# Patient Record
Sex: Male | Born: 1990 | Race: White | Hispanic: No | Marital: Single | Smoking: Current every day smoker
Health system: Southern US, Community
[De-identification: ages and names within clinical notes are randomized; demographics above are authoritative.]

## PROBLEM LIST (undated history)

## (undated) DIAGNOSIS — M549 Dorsalgia, unspecified: Secondary | ICD-10-CM

---
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
## 2014-01-07 ENCOUNTER — Emergency Department: Payer: Self-pay | Admitting: Emergency Medicine

## 2015-11-14 ENCOUNTER — Emergency Department: Payer: Self-pay

## 2015-11-14 ENCOUNTER — Emergency Department
Admission: EM | Admit: 2015-11-14 | Discharge: 2015-11-14 | Disposition: A | Discharge status: Home / Self Care | Payer: Self-pay | Attending: Emergency Medicine | Admitting: Emergency Medicine

## 2015-11-14 ENCOUNTER — Encounter: Payer: Self-pay | Admitting: *Deleted

## 2015-11-14 DIAGNOSIS — M5440 Lumbago with sciatica, unspecified side: Secondary | ICD-10-CM | POA: Insufficient documentation

## 2015-11-14 DIAGNOSIS — M5430 Sciatica, unspecified side: Secondary | ICD-10-CM

## 2015-11-14 DIAGNOSIS — F17210 Nicotine dependence, cigarettes, uncomplicated: Secondary | ICD-10-CM | POA: Insufficient documentation

## 2015-11-14 MED ORDER — HYDROMORPHONE HCL 1 MG/ML IJ SOLN
1.0000 mg | Freq: Once | INTRAMUSCULAR | Status: AC
  Administered 2015-11-14: 1 mg via INTRAMUSCULAR
  Filled 2015-11-14: qty 1

## 2015-11-14 MED ORDER — METHYLPREDNISOLONE 4 MG PO TBPK: ORAL_TABLET | ORAL | 0 refills | Status: DC

## 2015-11-14 MED ORDER — ORPHENADRINE CITRATE 30 MG/ML IJ SOLN
60.0000 mg | Freq: Two times a day (BID) | INTRAMUSCULAR | Status: DC
  Administered 2015-11-14: 60 mg via INTRAMUSCULAR
  Filled 2015-11-14: qty 2

## 2015-11-14 MED ORDER — DEXAMETHASONE SODIUM PHOSPHATE 10 MG/ML IJ SOLN
10.0000 mg | Freq: Once | INTRAMUSCULAR | Status: AC
  Administered 2015-11-14: 10 mg via INTRAMUSCULAR
  Filled 2015-11-14: qty 1

## 2015-11-14 MED ORDER — TRAMADOL HCL 50 MG PO TABS: 50.0000 mg | ORAL_TABLET | Freq: Four times a day (QID) | ORAL | 0 refills | Status: AC | PRN

## 2015-11-14 MED ORDER — CYCLOBENZAPRINE HCL 10 MG PO TABS: 10.0000 mg | ORAL_TABLET | Freq: Three times a day (TID) | ORAL | 0 refills | Status: DC | PRN

## 2015-11-14 NOTE — ED Triage Notes (Signed)
Pt complains of low back pain with pain radiating down legs, pt reports  Pain is greater on right side, pt denies any other symptoms

## 2015-11-14 NOTE — ED Notes (Signed)
Having lower back pain which radiates into both leg  Pain is worse on the right   Ambulates well to treatment

## 2015-11-14 NOTE — ED Provider Notes (Signed)
[facility] Emergency Department Provider Note   ____________________________________________   First MD Initiated Contact with Patient 11/14/15 1310     (approximate)  I have reviewed the triage vital signs and the nursing notes.   HISTORY  Chief Complaint Back Pain    HPI Timothy Fields is a 25 y.o. male patient complain of radicular low back pain to the bilateral extremities. Patient stated onset was this morning. Patient stated pain is greater on the right side. Patient denies any bladder or bowel dysfunction. Patient said is similar incident as a teenager approximately 5-7 years ago. Patient denies any provocative incident for his complaint. No palliative measures taken for his complaint.   History reviewed. No pertinent past medical history.  There are no active problems to display for this patient.   History reviewed. No pertinent surgical history.  Prior to Admission medications   Medication Sig Start Date End Date Taking? Authorizing Provider  cyclobenzaprine (FLEXERIL) 10 MG tablet Take 1 tablet (10 mg total) by mouth 3 (three) times daily as needed. 11/14/15   Joni Reiningonald K Elowen Debruyn, PA-C  methylPREDNISolone (MEDROL DOSEPAK) 4 MG TBPK tablet Take Tapered dose as directed 11/14/15   Joni Reiningonald K Callaway Hailes, PA-C  traMADol (ULTRAM) 50 MG tablet Take 1 tablet (50 mg total) by mouth every 6 (six) hours as needed. 11/14/15 11/13/16  Joni Reiningonald K Chares Slaymaker, PA-C    Allergies Review of patient's allergies indicates no known allergies.  No family history on file.  Social History Social History  Substance Use Topics  . Smoking status: Current Every Day Smoker    Types: Cigarettes  . Smokeless tobacco: Not on file  . Alcohol use No    Review of Systems Constitutional: No fever/chills Eyes: No visual changes. ENT: No sore throat. Cardiovascular: Denies chest pain. Respiratory: Denies shortness of breath. Gastrointestinal: No abdominal pain.  No nausea, no  vomiting.  No diarrhea.  No constipation. Genitourinary: Negative for dysuria. Musculoskeletal: No back pain  Skin: Negative for rash. Neurological: Negative for headaches, focal weakness or numbness. ____________________________________________   PHYSICAL EXAM:  VITAL SIGNS: ED Triage Vitals  Enc Vitals Group     BP 11/14/15 1300 (!) 147/85     Pulse Rate 11/14/15 1300 95     Resp 11/14/15 1300 20     Temp 11/14/15 1300 98.2 F (36.8 C)     Temp Source 11/14/15 1300 Oral     SpO2 11/14/15 1300 100 %     Weight 11/14/15 1301 245 lb (111.1 kg)     Height 11/14/15 1301 6\' 1"  (1.854 m)     Head Circumference --      Peak Flow --      Pain Score 11/14/15 1257 10     Pain Loc --      Pain Edu? --      Excl. in GC? --     Constitutional: Alert and oriented. Well appearing and in no acute distress. Eyes: Conjunctivae are normal. PERRL. EOMI. Head: Atraumatic. Nose: No congestion/rhinnorhea. Mouth/Throat: Mucous membranes are moist.  Oropharynx non-erythematous. Neck: No stridor.  No cervical spine tenderness to palpation. Hematological/Lymphatic/Immunilogical: No cervical lymphadenopathy. Cardiovascular: Normal rate, regular rhythm. Grossly normal heart sounds.  Good peripheral circulation. Respiratory: Normal respiratory effort.  No retractions. Lungs CTAB. Gastrointestinal: Soft and nontender. No distention. No abdominal bruits. No CVA tenderness. Musculoskeletal: No obvious deformities to the low back. Patient has moderate guarding palpation L2-L5 5. Patient decreased range of motion in all fields. The patient  hasn't negative straight leg test.  Neurologic:  Normal speech and language. No gross focal neurologic deficits are appreciated. No gait instability. Skin:  Skin is warm, dry and intact. No rash noted. Psychiatric: Mood and affect are normal. Speech and behavior are normal.  ____________________________________________   LABS (all labs ordered are listed, but only  abnormal results are displayed)  Labs Reviewed - No data to display ____________________________________________  EKG   ____________________________________________  RADIOLOGY  No acute final x-ray of the lumbar spine. ____________________________________________   PROCEDURES  Procedure(s) performed: None  Procedures  Critical Care performed: No  ____________________________________________   INITIAL IMPRESSION / ASSESSMENT AND PLAN / ED COURSE  Pertinent labs & imaging results that were available during my care of the patient were reviewed by me and considered in my medical decision making (see chart for details).  Radicular back pain. Discussed x-ray finding with patient. Patient given discharge care instructions. Advised [facility]. Patient given a prescription for tramadol, Flexeril, and Medrol Dosepak.  Clinical Course     ____________________________________________   FINAL CLINICAL IMPRESSION(S) / ED DIAGNOSES  Final diagnoses:  Sciatica, unspecified laterality      NEW MEDICATIONS STARTED DURING THIS VISIT:  New Prescriptions   CYCLOBENZAPRINE (FLEXERIL) 10 MG TABLET    Take 1 tablet (10 mg total) by mouth 3 (three) times daily as needed.   METHYLPREDNISOLONE (MEDROL DOSEPAK) 4 MG TBPK TABLET    Take Tapered dose as directed   TRAMADOL (ULTRAM) 50 MG TABLET    Take 1 tablet (50 mg total) by mouth every 6 (six) hours as needed.     Note:  This document was prepared using Dragon voice recognition software and [place] include unintentional dictation errors.    Joni Reining, PA-C 11/14/15 1400    Governor Rooks, MD 11/14/15 [phone]

## 2015-12-29 ENCOUNTER — Emergency Department: Payer: Self-pay

## 2015-12-29 ENCOUNTER — Encounter: Payer: Self-pay | Admitting: Emergency Medicine

## 2015-12-29 ENCOUNTER — Emergency Department
Admission: EM | Admit: 2015-12-29 | Discharge: 2015-12-29 | Disposition: A | Discharge status: Home / Self Care | Payer: Self-pay | Attending: Student in an Organized Health Care Education/Training Program | Admitting: Student in an Organized Health Care Education/Training Program

## 2015-12-29 DIAGNOSIS — R079 Chest pain, unspecified: Secondary | ICD-10-CM | POA: Insufficient documentation

## 2015-12-29 DIAGNOSIS — F17210 Nicotine dependence, cigarettes, uncomplicated: Secondary | ICD-10-CM | POA: Insufficient documentation

## 2015-12-29 DIAGNOSIS — Z79899 Other long term (current) drug therapy: Secondary | ICD-10-CM | POA: Insufficient documentation

## 2015-12-29 DIAGNOSIS — M79604 Pain in right leg: Secondary | ICD-10-CM

## 2015-12-29 DIAGNOSIS — M79662 Pain in left lower leg: Secondary | ICD-10-CM | POA: Insufficient documentation

## 2015-12-29 DIAGNOSIS — M79605 Pain in left leg: Secondary | ICD-10-CM

## 2015-12-29 DIAGNOSIS — M79661 Pain in right lower leg: Secondary | ICD-10-CM | POA: Insufficient documentation

## 2015-12-29 MED ORDER — LORAZEPAM 1 MG PO TABS
ORAL_TABLET | ORAL | Status: AC
  Filled 2015-12-29: qty 1

## 2015-12-29 MED ORDER — METHOCARBAMOL 500 MG PO TABS: 500.0000 mg | ORAL_TABLET | Freq: Three times a day (TID) | ORAL | 0 refills | Status: DC | PRN

## 2015-12-29 MED ORDER — LORAZEPAM 1 MG PO TABS
1.0000 mg | ORAL_TABLET | Freq: Once | ORAL | Status: AC
  Administered 2015-12-29: 1 mg via ORAL

## 2015-12-29 NOTE — ED Provider Notes (Signed)
[facility] Emergency Department Provider Note    First MD Initiated Contact with Patient 12/29/15 1356     (approximate)  I have reviewed the triage vital signs and the nursing notes.   HISTORY  Chief Complaint Leg Pain; Shortness of Breath; and Back Pain    HPI Timothy Fields is a 25 y.o. male  who presents with chief complaint of bilateral leg throbbing and left-sided back pain for greater than 2 weeks. Patient states she also started developing a cough yesterday and feels a tightness in the left side of his chest with numbness and tingling radiating to bilateral upper extremities. States that his leg pain is worse after standing or walking for a long period of time. Denies any swelling. No fevers. No chest pain at this time. States she's been having these symptoms on and off since he was 25 years old. Was previously seen for sciatica states this is different. No history of blood clots. Does not smoke. States he has been feeling anxious lately but is not on any antidepressant or anti-anxiety medications. Denies any SI or HI.   History reviewed. No pertinent past medical history. History reviewed. No pertinent family history. History reviewed. No pertinent surgical history. There are no active problems to display for this patient.     Prior to Admission medications   Medication Sig Start Date End Date Taking? Authorizing Provider  cyclobenzaprine (FLEXERIL) 10 MG tablet Take 1 tablet (10 mg total) by mouth 3 (three) times daily as needed. 11/14/15   Joni Reiningonald K Smith, PA-C  methylPREDNISolone (MEDROL DOSEPAK) 4 MG TBPK tablet Take Tapered dose as directed 11/14/15   Joni Reiningonald K Smith, PA-C  traMADol (ULTRAM) 50 MG tablet Take 1 tablet (50 mg total) by mouth every 6 (six) hours as needed. 11/14/15 11/13/16  Joni Reiningonald K Smith, PA-C    Allergies Ibuprofen and Tylenol [acetaminophen]    Social History Social History  Substance Use Topics  . Smoking status:  Current Every Day Smoker    Types: Cigarettes  . Smokeless tobacco: Never Used  . Alcohol use No    Review of Systems Patient denies headaches, rhinorrhea, blurry vision, numbness, shortness of breath, chest pain, edema, cough, abdominal pain, nausea, vomiting, diarrhea, dysuria, fevers, rashes or hallucinations unless otherwise stated above in HPI. ____________________________________________   PHYSICAL EXAM:  VITAL SIGNS: Vitals:   12/29/15 1347  BP: (!) 142/90  Pulse: 84  Resp: 16  Temp: 98 F (36.7 C)    Constitutional: Alert and oriented. anxious appearing but in no acute distress. Eyes: Conjunctivae are normal. PERRL. EOMI. Head: Atraumatic. Nose: No congestion/rhinnorhea. Mouth/Throat: Mucous membranes are moist.  Oropharynx non-erythematous. Neck: No stridor. Painless ROM. No cervical spine tenderness to palpation Hematological/Lymphatic/Immunilogical: No cervical lymphadenopathy. Cardiovascular: Normal rate, regular rhythm. Grossly normal heart sounds.  Good peripheral circulation. Respiratory: Normal respiratory effort.  No retractions. Lungs CTAB. Gastrointestinal: Soft and nontender. No distention. No abdominal bruits. No CVA tenderness.  Musculoskeletal: No lower extremity tenderness nor edema. Reflexes are 2+ bilaterally, No joint effusions. Neurologic:  CN- intact.  No facial droop, Normal FNF.  Normal heel to shin.  Sensation intact bilaterally. Normal speech and language. No gross focal neurologic deficits are appreciated. No gait instability.  Able to ambulate down hall without any sign of fatigue or distress  Skin:  Skin is warm, dry and intact. No rash noted. Psychiatric: Mood and affect are normal. Speech and behavior are normal.  ____________________________________________   LABS (all labs ordered are  listed, but only abnormal results are displayed)  No results found for this or any previous visit (from the past 24  hour(s)). ____________________________________________  EKG My review and personal interpretation at Time: 14:18   Indication: chest pain  Rate: 75  Rhythm: sinus Axis: normal Other: no acute st changes, normal intervals, no WPW or brugada ____________________________________________  RADIOLOGY  I personally reviewed all radiographic images ordered to evaluate for the above acute complaints and reviewed radiology reports and findings.  These findings were personally discussed with the patient.  Please see medical record for radiology report.  ____________________________________________   PROCEDURES  Procedure(s) performed: none Procedures    Critical Care performed: no ____________________________________________   INITIAL IMPRESSION / ASSESSMENT AND PLAN / ED COURSE  Pertinent labs & imaging results that were available during my care of the patient were reviewed by me and considered in my medical decision making (see chart for details).  DDX: pna, dvt, pe, neuropathic pain, claudication  Timothy Fields is a 25 y.o. who presents to the ED with above complaint of leg pain and left-sided chest pain. Patient afebrile and hemodynamically stable. Patient is low risk Wells and Perc negative. Do not feel is clinically consistent with PE. His lower extremity is well perfused with strong PT and DP pulses and brisk cap refill. Do not feel this consistent with claudication. Other pathologies such as Guillain-Barr also come to mind but clinically not demonstrating any sign of neurologic deficit and has not had any recent viral illness. Other than subjective complaint of achy pain in his legs which is been ongoing intermittently for 17 years he has no objective findings of pathology. Will order chest x-ray to evaluate for any pneumonia. We will order EKG to evaluate for any pericarditis or ischemic changes. Will order ultrasound of lower extremity to evaluate for any signs of DVT. Patient is very  anxious appearing with history of reported anxiety which [place] be highly contributing to his presentation as he is an otherwise healthy 25 year old male.  Clinical Course as of Dec 28 1541  Fri Dec 29, 2015  1449 US Venous Img Lower Bilateral [PR]  1535 Chest x-ray without any focal infiltrate. Ultrasound without any DVT. EKG shows no acute ischemia or dysrhythmia. Do not find any objective signs of pathology and do not feel laboratory testing clinically indicated this time. We'll provide referral for outpatient management as well as multiple relaxers. Patient was able to tolerate PO and was able to ambulate with a steady gait. Have discussed with the patient and available family all diagnostics and treatments performed thus far and all questions were answered to the best of my ability. The patient demonstrates understanding and agreement with plan.   [PR]    Clinical Course User Index [PR] Willy EddyPatrick Wreatha Sturgeon, MD     ____________________________________________   FINAL CLINICAL IMPRESSION(S) / ED DIAGNOSES  Final diagnoses:  Chest pain, unspecified type  Pain in both lower extremities      NEW MEDICATIONS STARTED DURING THIS VISIT:  New Prescriptions   No medications on file     Note:  This document was prepared using Dragon voice recognition software and Parr include unintentional dictation errors.    Willy EddyPatrick Disaya Walt, MD 12/29/15 [phone]

## 2015-12-29 NOTE — ED Triage Notes (Signed)
Pt to ED c/o bilateral leg and left back pain x1 week.  States "feels like it's my lungs".  States feels tight and stabbing to legs and lungs.  Pt presents A&Ox4, chest rise even and unlabored, skim warm and clammy.

## 2016-02-17 ENCOUNTER — Emergency Department: Payer: Self-pay

## 2016-02-17 ENCOUNTER — Encounter: Payer: Self-pay | Admitting: Emergency Medicine

## 2016-02-17 ENCOUNTER — Emergency Department
Admission: EM | Admit: 2016-02-17 | Discharge: 2016-02-17 | Disposition: A | Discharge status: Home / Self Care | Payer: Self-pay | Attending: Emergency Medicine | Admitting: Emergency Medicine

## 2016-02-17 DIAGNOSIS — J189 Pneumonia, unspecified organism: Secondary | ICD-10-CM | POA: Insufficient documentation

## 2016-02-17 DIAGNOSIS — F17210 Nicotine dependence, cigarettes, uncomplicated: Secondary | ICD-10-CM | POA: Insufficient documentation

## 2016-02-17 DIAGNOSIS — Z79899 Other long term (current) drug therapy: Secondary | ICD-10-CM | POA: Insufficient documentation

## 2016-02-17 HISTORY — DX: Dorsalgia, unspecified: M54.9

## 2016-02-17 LAB — CBC
HCT: 50.2 % (ref 40.0–52.0)
Hemoglobin: 17.2 g/dL (ref 13.0–18.0)
MCH: 31 pg (ref 26.0–34.0)
MCHC: 34.3 g/dL (ref 32.0–36.0)
MCV: 90.3 fL (ref 80.0–100.0)
PLATELETS: 218 10*3/uL (ref 150–440)
RBC: 5.56 MIL/uL (ref 4.40–5.90)
RDW: 13.6 % (ref 11.5–14.5)
WBC: 13.2 10*3/uL — AB (ref 3.8–10.6)

## 2016-02-17 LAB — BASIC METABOLIC PANEL
Anion gap: 8 (ref 5–15)
BUN: 11 mg/dL (ref 6–20)
CALCIUM: 9.7 mg/dL (ref 8.9–10.3)
CO2: 24 mmol/L (ref 22–32)
CREATININE: 1.16 mg/dL (ref 0.61–1.24)
Chloride: 106 mmol/L (ref 101–111)
GFR calc Af Amer: >60 mL/min (ref >60)
Glucose, Bld: 100 mg/dL — ABNORMAL HIGH (ref 65–99)
Potassium: 4.1 mmol/L (ref 3.5–5.1)
SODIUM: 138 mmol/L (ref 135–145)

## 2016-02-17 LAB — RAPID HIV SCREEN (HIV 1/2 AB+AG)
HIV 1/2 ANTIBODIES: NONREACTIVE
HIV-1 P24 ANTIGEN - HIV24: NONREACTIVE

## 2016-02-17 LAB — TROPONIN I

## 2016-02-17 MED ORDER — SODIUM CHLORIDE 0.9 % IV BOLUS (SEPSIS)
1000.0000 mL | Freq: Once | INTRAVENOUS | Status: AC
  Administered 2016-02-17: 1000 mL via INTRAVENOUS

## 2016-02-17 MED ORDER — IPRATROPIUM-ALBUTEROL 0.5-2.5 (3) MG/3ML IN SOLN
3.0000 mL | Freq: Once | RESPIRATORY_TRACT | Status: AC
  Administered 2016-02-17: 3 mL via RESPIRATORY_TRACT
  Filled 2016-02-17: qty 3

## 2016-02-17 MED ORDER — DOXYCYCLINE HYCLATE 100 MG PO CAPS: 100.0000 mg | ORAL_CAPSULE | Freq: Two times a day (BID) | ORAL | 0 refills | Status: AC

## 2016-02-17 MED ORDER — DOXYCYCLINE HYCLATE 100 MG PO TABS
100.0000 mg | ORAL_TABLET | Freq: Once | ORAL | Status: AC
  Administered 2016-02-17: 100 mg via ORAL
  Filled 2016-02-17: qty 1

## 2016-02-17 MED ORDER — KETOROLAC TROMETHAMINE 30 MG/ML IJ SOLN
INTRAMUSCULAR | Status: AC
  Administered 2016-02-17: 30 mg via INTRAVENOUS
  Filled 2016-02-17: qty 1

## 2016-02-17 MED ORDER — KETOROLAC TROMETHAMINE 30 MG/ML IJ SOLN
30.0000 mg | Freq: Once | INTRAMUSCULAR | Status: AC
  Administered 2016-02-17: 30 mg via INTRAVENOUS

## 2016-02-17 MED ORDER — ALBUTEROL SULFATE HFA 108 (90 BASE) MCG/ACT IN AERS: 2.0000 | INHALATION_SPRAY | Freq: Four times a day (QID) | RESPIRATORY_TRACT | 2 refills | Status: DC | PRN

## 2016-02-17 MED ORDER — IPRATROPIUM-ALBUTEROL 0.5-2.5 (3) MG/3ML IN SOLN
3.0000 mL | Freq: Once | RESPIRATORY_TRACT | Status: AC
  Administered 2016-02-17: 3 mL via RESPIRATORY_TRACT

## 2016-02-17 NOTE — ED Notes (Signed)
Patient's discharge and follow up information reviewed with patient by ED nursing staff and patient given the opportunity to ask questions pertaining to ED visit and discharge plan of care. Patient advised that should symptoms not continue to improve, resolve entirely, or should new symptoms develop then a follow up visit with their PCP or a return visit to the ED [facility] be warranted. Patient verbalized consent and understanding of discharge plan of care including potential need for further evaluation. Patient discharged in stable condition per attending ED physician on duty.  

## 2016-02-17 NOTE — ED Provider Notes (Signed)
[facility] Emergency Department Provider Note  ____________________________________________  Time seen: Approximately 3:14 PM  I have reviewed the triage vital signs and the nursing notes.   HISTORY  Chief Complaint Tachycardia and Chest Pain   HPI Timothy Fields is a 26 y.o. male a history of heavy smoking who presents for evaluation of coughing and chest pain. Patient reports that week of cough productive of orange sputum and for the last few days has had chest pain. The chest pain as a soreness, severe, located in the center of his chest, worse when he coughs. Today he had a coughing fit that was so severe that he felt like his heart was coming to jump out of his chest and he felt dizzy. No fever or chills, no nausea or vomiting, no diarrhea. Patient endorses family history of heart disease in his father's side. No personal or family history blood clots, no hemoptysis, no recent travel or immobilization, no leg pain or swelling. His nephew was recently diagnosed with the flu. Patient also endorses mild shortness of breath since the coughing started. He continues to smoke.  Past Medical History:  Diagnosis Date  . Back pain     There are no active problems to display for this patient.   History reviewed. No pertinent surgical history.  Prior to Admission medications   Medication Sig Start Date End Date Taking? Authorizing Provider  cyclobenzaprine (FLEXERIL) 10 MG tablet Take 1 tablet (10 mg total) by mouth 3 (three) times daily as needed. 11/14/15  Yes Joni Reining, PA-C  methocarbamol (ROBAXIN) 500 MG tablet Take 1 tablet (500 mg total) by mouth every 8 (eight) hours as needed for muscle spasms. 12/29/15  Yes Willy Eddy, MD  traMADol (ULTRAM) 50 MG tablet Take 1 tablet (50 mg total) by mouth every 6 (six) hours as needed. 11/14/15 11/13/16 Yes Joni Reining, PA-C  albuterol (PROVENTIL HFA;VENTOLIN HFA) 108 (90 Base) MCG/ACT inhaler Inhale 2 puffs  into the lungs every 6 (six) hours as needed for wheezing or shortness of breath. 02/17/16   Nita Sickle, MD  doxycycline (VIBRAMYCIN) 100 MG capsule Take 1 capsule (100 mg total) by mouth 2 (two) times daily. 02/17/16 02/24/16  Nita Sickle, MD  methylPREDNISolone (MEDROL DOSEPAK) 4 MG TBPK tablet Take Tapered dose as directed Patient not taking: Reported on 02/17/2016 11/14/15   Joni Reining, PA-C    Allergies Ibuprofen and Tylenol [acetaminophen]  History reviewed. No pertinent family history.  Social History Social History  Substance Use Topics  . Smoking status: Current Every Day Smoker    Packs/day: 2.00    Types: Cigarettes  . Smokeless tobacco: Never Used  . Alcohol use No    Review of Systems  Constitutional: Negative for fever. Eyes: Negative for visual changes. ENT: Negative for sore throat. Neck: No neck pain  Cardiovascular: + chest pain. Respiratory: + shortness of breath, cough Gastrointestinal: Negative for abdominal pain, vomiting or diarrhea. Genitourinary: Negative for dysuria. Musculoskeletal: Negative for back pain. Skin: Negative for rash. Neurological: Negative for headaches, weakness or numbness. Psych: No SI or HI  ____________________________________________   PHYSICAL EXAM:  VITAL SIGNS: ED Triage Vitals  Enc Vitals Group     BP 02/17/16 1337 129/81     Pulse Rate 02/17/16 1337 (!) 108     Resp 02/17/16 1337 16     Temp 02/17/16 1337 98.4 F (36.9 C)     Temp Source 02/17/16 1337 Oral     SpO2 02/17/16  1337 98 %     Weight 02/17/16 1338 245 lb (111.1 kg)     Height 02/17/16 1338 6\' 1"  (1.854 m)     Head Circumference --      Peak Flow --      Pain Score 02/17/16 1338 7     Pain Loc --      Pain Edu? --      Excl. in GC? --     Constitutional: Alert and oriented. Well appearing and in no apparent distress. HEENT:      Head: Normocephalic and atraumatic.         Eyes: Conjunctivae are normal. Sclera is non-icteric. EOMI.  PERRL      Mouth/Throat: Mucous membranes are moist.       Neck: Supple with no signs of meningismus. Cardiovascular: Tachycardic with regular rhythm. No murmurs, gallops, or rubs. 2+ symmetrical distal pulses are present in all extremities. No JVD. Respiratory: Normal respiratory effort. Decreased air movement bilaterally with no wheezes or crackles Gastrointestinal: Soft, non tender, and non distended with positive bowel sounds. No rebound or guarding. Musculoskeletal: Nontender with normal range of motion in all extremities. No edema, cyanosis, or erythema of extremities. Neurologic: Normal speech and language. Face is symmetric. Moving all extremities. No gross focal neurologic deficits are appreciated. Skin: Skin is warm, dry and intact. No rash noted. Psychiatric: Mood and affect are normal. Speech and behavior are normal.  ____________________________________________   LABS (all labs ordered are listed, but only abnormal results are displayed)  Labs Reviewed  BASIC METABOLIC PANEL - Abnormal; Notable for the following:       Result Value   Glucose, Bld 100 (*)    All other components within normal limits  CBC - Abnormal; Notable for the following:    WBC 13.2 (*)    All other components within normal limits  TROPONIN I  RAPID HIV SCREEN (HIV 1/2 AB+AG)   ____________________________________________  EKG   ED ECG REPORT I, Nita Sicklearolina Maddalena Linarez, the attending physician, personally viewed and interpreted this ECG.  Sinus tachycardia with rare PVCs, rate of 109, normal intervals, normal axis, no ST elevations or depressions. ____________________________________________  RADIOLOGY  CXR:  Subtle opacification of the right middle lobe which Antonetti be due to atelectasis versus early infection. ____________________________________________   PROCEDURES  Procedure(s) performed: None Procedures Critical Care performed:   None ____________________________________________   INITIAL IMPRESSION / ASSESSMENT AND PLAN / ED COURSE  26 y.o. male a history of heavy smoking who presents for evaluation of coughing and chest pain x 1 week. Patient is afebrile, tachycardic with heart rate of 108, chest x-ray concerning for pneumonia, patient with mild leukocytosis consistent with pneumonia picture. Also has a history of heavy smoking for 10 years with decreased air movement bilaterally. We'll give DuoNeb treatments, doxycycline, IV fluids for tachycardia, and Toradol for the chest discomfort. EKG with of this of ischemia. Troponin is negative.  Clinical Course as of Feb 17 1707  Sat Feb 17, 2016  1707 Vital signs normalized after IV fluids. Patient's chest pain and shortness of breath resolved after DuoNeb treatment. He is going to be discharged home on albuterol, doxycycline for pneumonia and close follow-up with primary care doctor. Smoking cessation counseling was provided to patient.  [CV]    Clinical Course User Index [CV] Nita Sicklearolina Suleyman Ehrman, MD    Pertinent labs & imaging results that were available during my care of the patient were reviewed by me and considered in my medical decision making (  see chart for details).    ____________________________________________   FINAL CLINICAL IMPRESSION(S) / ED DIAGNOSES  Final diagnoses:  Community acquired pneumonia, unspecified laterality      NEW MEDICATIONS STARTED DURING THIS VISIT:  New Prescriptions   ALBUTEROL (PROVENTIL HFA;VENTOLIN HFA) 108 (90 BASE) MCG/ACT INHALER    Inhale 2 puffs into the lungs every 6 (six) hours as needed for wheezing or shortness of breath.   DOXYCYCLINE (VIBRAMYCIN) 100 MG CAPSULE    Take 1 capsule (100 mg total) by mouth 2 (two) times daily.     Note:  This document was prepared using Dragon voice recognition software and Hasty include unintentional dictation errors.    Nita Sickle, MD 02/17/16 [phone]

## 2016-02-17 NOTE — ED Notes (Signed)
Patient c/o intermittent left chest pain radiating to back X several months. Productive cough with orange sputum X 2 weeks.

## 2016-02-17 NOTE — ED Triage Notes (Signed)
Pt reports having had a cough and chest pain for over a week. Pt reports chest pain felt "like my heart was going to rip out of my chest today." Pt reports becoming lightheaded while coughing and having a near syncopal episode. Pt denies fevers but reports nausea over the past week as well. Pt also reports increased fatigue. Pt reports nephew has recently had the flu.

## 2017-04-16 ENCOUNTER — Emergency Department
Admission: EM | Admit: 2017-04-16 | Discharge: 2017-04-16 | Disposition: A | Discharge status: Home / Self Care | Payer: Self-pay | Attending: Emergency Medicine | Admitting: Emergency Medicine

## 2017-04-16 ENCOUNTER — Other Ambulatory Visit: Payer: Self-pay

## 2017-04-16 ENCOUNTER — Emergency Department: Payer: Self-pay

## 2017-04-16 ENCOUNTER — Encounter: Payer: Self-pay | Admitting: Emergency Medicine

## 2017-04-16 DIAGNOSIS — Y929 Unspecified place or not applicable: Secondary | ICD-10-CM | POA: Insufficient documentation

## 2017-04-16 DIAGNOSIS — S29019A Strain of muscle and tendon of unspecified wall of thorax, initial encounter: Secondary | ICD-10-CM | POA: Insufficient documentation

## 2017-04-16 DIAGNOSIS — Z87891 Personal history of nicotine dependence: Secondary | ICD-10-CM | POA: Insufficient documentation

## 2017-04-16 DIAGNOSIS — X58XXXA Exposure to other specified factors, initial encounter: Secondary | ICD-10-CM | POA: Insufficient documentation

## 2017-04-16 DIAGNOSIS — R112 Nausea with vomiting, unspecified: Secondary | ICD-10-CM | POA: Insufficient documentation

## 2017-04-16 DIAGNOSIS — R0602 Shortness of breath: Secondary | ICD-10-CM | POA: Insufficient documentation

## 2017-04-16 DIAGNOSIS — J40 Bronchitis, not specified as acute or chronic: Secondary | ICD-10-CM | POA: Insufficient documentation

## 2017-04-16 DIAGNOSIS — R05 Cough: Secondary | ICD-10-CM | POA: Insufficient documentation

## 2017-04-16 DIAGNOSIS — R042 Hemoptysis: Secondary | ICD-10-CM | POA: Insufficient documentation

## 2017-04-16 DIAGNOSIS — Y939 Activity, unspecified: Secondary | ICD-10-CM | POA: Insufficient documentation

## 2017-04-16 DIAGNOSIS — Y998 Other external cause status: Secondary | ICD-10-CM | POA: Insufficient documentation

## 2017-04-16 LAB — BASIC METABOLIC PANEL
ANION GAP: 10 (ref 5–15)
BUN: 7 mg/dL (ref 6–20)
CO2: 27 mmol/L (ref 22–32)
Calcium: 9.4 mg/dL (ref 8.9–10.3)
Chloride: 105 mmol/L (ref 101–111)
Creatinine, Ser: 1.07 mg/dL (ref 0.61–1.24)
Glucose, Bld: 82 mg/dL (ref 65–99)
Potassium: 3.8 mmol/L (ref 3.5–5.1)
SODIUM: 142 mmol/L (ref 135–145)

## 2017-04-16 LAB — CBC
HCT: 46.1 % (ref 40.0–52.0)
HEMOGLOBIN: 15.9 g/dL (ref 13.0–18.0)
MCH: 31.2 pg (ref 26.0–34.0)
MCHC: 34.6 g/dL (ref 32.0–36.0)
MCV: 90.3 fL (ref 80.0–100.0)
PLATELETS: 217 10*3/uL (ref 150–440)
RBC: 5.11 MIL/uL (ref 4.40–5.90)
RDW: 13.4 % (ref 11.5–14.5)
WBC: 7.8 10*3/uL (ref 3.8–10.6)

## 2017-04-16 LAB — FIBRIN DERIVATIVES D-DIMER (ARMC ONLY): FIBRIN DERIVATIVES D-DIMER (ARMC): 162.43 ng{FEU}/mL (ref 0.00–499.00)

## 2017-04-16 LAB — TROPONIN I

## 2017-04-16 MED ORDER — FAMOTIDINE 20 MG PO TABS: 20.0000 mg | ORAL_TABLET | Freq: Two times a day (BID) | ORAL | 0 refills | Status: DC

## 2017-04-16 MED ORDER — IBUPROFEN 600 MG PO TABS: 600.0000 mg | ORAL_TABLET | Freq: Four times a day (QID) | ORAL | 0 refills | Status: DC | PRN

## 2017-04-16 MED ORDER — PREDNISONE 20 MG PO TABS: 60.0000 mg | ORAL_TABLET | Freq: Every day | ORAL | 0 refills | Status: AC

## 2017-04-16 MED ORDER — LIDOCAINE 5 % EX PTCH
1.0000 | MEDICATED_PATCH | CUTANEOUS | Status: DC
  Administered 2017-04-16: 1 via TRANSDERMAL
  Filled 2017-04-16: qty 1

## 2017-04-16 MED ORDER — ALBUTEROL SULFATE HFA 108 (90 BASE) MCG/ACT IN AERS: 2.0000 | INHALATION_SPRAY | Freq: Four times a day (QID) | RESPIRATORY_TRACT | 2 refills | Status: DC | PRN

## 2017-04-16 MED ORDER — PREDNISONE 20 MG PO TABS
60.0000 mg | ORAL_TABLET | Freq: Once | ORAL | Status: AC
  Administered 2017-04-16: 60 mg via ORAL
  Filled 2017-04-16: qty 3

## 2017-04-16 MED ORDER — IPRATROPIUM-ALBUTEROL 0.5-2.5 (3) MG/3ML IN SOLN
3.0000 mL | Freq: Once | RESPIRATORY_TRACT | Status: AC
  Administered 2017-04-16: 3 mL via RESPIRATORY_TRACT
  Filled 2017-04-16: qty 3

## 2017-04-16 MED ORDER — KETOROLAC TROMETHAMINE 30 MG/ML IJ SOLN
15.0000 mg | Freq: Once | INTRAMUSCULAR | Status: AC
  Administered 2017-04-16: 15 mg via INTRAVENOUS
  Filled 2017-04-16: qty 1

## 2017-04-16 MED ORDER — ONDANSETRON HCL 4 MG/2ML IJ SOLN
4.0000 mg | Freq: Once | INTRAMUSCULAR | Status: AC
  Administered 2017-04-16: 4 mg via INTRAVENOUS
  Filled 2017-04-16: qty 2

## 2017-04-16 NOTE — ED Provider Notes (Signed)
[facility] Emergency Department Provider Note  ____________________________________________  Time seen: Approximately 9:06 PM  I have reviewed the triage vital signs and the nursing notes.   HISTORY  Chief Complaint Back Pain and Cough   HPI Timothy Fields is a 27 y.o. male history of depression, former smoking and presents for evaluation of left upper back pain and hemoptysis. Patient reports one week of sharp pleuritic stabbing pain located in the left upper back. The pain is severe with coughing or deep breathing. Associated with shortness of breath and hemoptysis. Also has had nausea and vomiting for a week. No fever or chills. Patient reports that he stop smoking a year ago when he was seen here and diagnosed with pneumonia. He denies leg pain or swelling, personal or family history of blood clots, recent travel or immobilization, exogenous hormones, or history of cancer.  Past Medical History:  Diagnosis Date  . Back pain     Prior to Admission medications   Medication Sig Start Date End Date Taking? Authorizing Provider  albuterol (PROVENTIL HFA;VENTOLIN HFA) 108 (90 Base) MCG/ACT inhaler Inhale 2 puffs into the lungs every 6 (six) hours as needed for wheezing or shortness of breath. 04/16/17   Don PerkingVeronese, [place], MD  cyclobenzaprine (FLEXERIL) 10 MG tablet Take 1 tablet (10 mg total) by mouth 3 (three) times daily as needed. 11/14/15   Joni ReiningSmith, Ronald K, PA-C  famotidine (PEPCID) 20 MG tablet Take 1 tablet (20 mg total) by mouth 2 (two) times daily for 5 days. 04/16/17 04/21/17  Nita SickleVeronese, [place], MD  ibuprofen (ADVIL,MOTRIN) 600 MG tablet Take 1 tablet (600 mg total) by mouth every 6 (six) hours as needed. 04/16/17   Nita SickleVeronese, [place], MD  methocarbamol (ROBAXIN) 500 MG tablet Take 1 tablet (500 mg total) by mouth every 8 (eight) hours as needed for muscle spasms. 12/29/15   Willy Eddyobinson, [place], MD  methylPREDNISolone (MEDROL DOSEPAK) 4 MG TBPK tablet Take  Tapered dose as directed Patient not taking: Reported on 02/17/2016 11/14/15   Joni ReiningSmith, Ronald K, PA-C  predniSONE (DELTASONE) 20 MG tablet Take 3 tablets (60 mg total) by mouth daily for 4 days. 04/16/17 04/20/17  Nita SickleVeronese, [place], MD    Allergies Ibuprofen; Tramadol; and Tylenol [acetaminophen]  No family history on file.  Social History Social History   Tobacco Use  . Smoking status: Former Smoker    Packs/day: 2.00    Types: Cigarettes  . Smokeless tobacco: Never Used  Substance Use Topics  . Alcohol use: No  . Drug use: No    Review of Systems  Constitutional: Negative for fever. Eyes: Negative for visual changes. ENT: Negative for sore throat. Neck: No neck pain  Cardiovascular: Negative for chest pain. Respiratory: + shortness of breath, hemoptysis Gastrointestinal: Negative for abdominal pain,  Diarrhea. + N.V Genitourinary: Negative for dysuria. Musculoskeletal: + L upper back pain. Skin: Negative for rash. Neurological: Negative for headaches, weakness or numbness. Psych: No SI or HI  ____________________________________________   PHYSICAL EXAM:  VITAL SIGNS: ED Triage Vitals  Enc Vitals Group     BP 04/16/17 2041 128/90     Pulse Rate 04/16/17 2041 77     Resp 04/16/17 2041 18     Temp 04/16/17 2041 98.4 F (36.9 C)     Temp Source 04/16/17 2041 Oral     SpO2 04/16/17 2041 97 %     Weight 04/16/17 2022 240 lb (108.9 kg)     Height 04/16/17 2022 6\' 1"  (1.854 m)  Head Circumference --      Peak Flow --      Pain Score 04/16/17 2021 10     Pain Loc --      Pain Edu? --      Excl. in GC? --     Constitutional: Alert and oriented. Well appearing and in no apparent distress. HEENT:      Head: Normocephalic and atraumatic.         Eyes: Conjunctivae are normal. Sclera is non-icteric.       Mouth/Throat: Mucous membranes are moist.       Neck: Supple with no signs of meningismus. Cardiovascular: Regular rate and rhythm. No murmurs, gallops, or  rubs. 2+ symmetrical distal pulses are present in all extremities. No JVD. Respiratory: Normal respiratory effort. normal sats, decreased air movement bilaterally with no wheezing or crackles. Gastrointestinal: Soft, non tender, and non distended with positive bowel sounds. No rebound or guarding. Genitourinary: No CVA tenderness. Musculoskeletal: Nontender with normal range of motion in all extremities. No edema, cyanosis, or erythema of extremities. Neurologic: Normal speech and language. Face is symmetric. Moving all extremities. No gross focal neurologic deficits are appreciated. Skin: Skin is warm, dry and intact. No rash noted. Psychiatric: Mood and affect are normal. Speech and behavior are normal.  ____________________________________________   LABS (all labs ordered are listed, but only abnormal results are displayed)  Labs Reviewed  BASIC METABOLIC PANEL  CBC  TROPONIN I  FIBRIN DERIVATIVES D-DIMER ([facility] ONLY)   ____________________________________________  EKG  ED ECG REPORT I, Nita Sickle, the attending physician, personally viewed and interpreted this ECG.  Normal sinus rhythm, rate of 81, normal intervals, normal axis, no ST elevations or depressions. Normal EKG.  ____________________________________________  RADIOLOGY  I have personally reviewed the images performed during this visit and I agree with the Radiologist's read.   Interpretation by Radiologist:  Dg Chest 2 View  Result Date: 04/16/2017 CLINICAL DATA:  Left upper back pain for 1 week with shortness of breath. EXAM: CHEST - 2 VIEW COMPARISON:  Two-view chest x-ray 02/17/2016 FINDINGS: The heart size and mediastinal contours are within normal limits. Both lungs are clear. The visualized skeletal structures are unremarkable. IMPRESSION: Negative two view chest x-ray Electronically Signed   By: Marin Roberts M.D.   On: 04/16/2017 20:44      ____________________________________________   PROCEDURES  Procedure(s) performed: None Procedures Critical Care performed:  None ____________________________________________   INITIAL IMPRESSION / ASSESSMENT AND PLAN / ED COURSE   27 y.o. male history of depression, former smoking and presents for evaluation of pleuritic left upper back pain, SOB, hemoptysis, nausea, and vomiting for 1 week. patient is well-appearing, in no distress, has completely normal vital signs, normal work of breathing, normal sats, he does have decreased air movement bilaterally with no wheezing or crackles. Patient was a heavy smoker until a year ago when he stopped. Patient low risk [place] will send d-dimer. At this time clinical suspicion for bronchitis/ viral syndrome with associated nausea and vomiting. CXR negative for PNA. Will give duoneb, toradol, zofran. EKG with no evidence of ischemia, tachycardia or S1Q3T3.    _________________________ 11:10 PM on 04/16/2017 -----------------------------------------  troponin 1 is negative. Since pain is ongoing for a week do not believe patient warrants a second troponin. D-dimer is also negative. Patient was better air movement after 1 DuoNeb. He was started on prednisone. Will be given a five-day course of prednisone and an inhaler for bronchitis. Tylenol and ibuprofen for the upper  back pain which is most likely musculoskeletal in nature. Discussed signs and symptoms of a heart attack and PE and recommended that he return if these develop otherwise he will follow-up with his primary care doctor.   As part of my medical decision making, I reviewed the following data within the electronic MEDICAL RECORD NUMBER Nursing notes reviewed and incorporated, Labs reviewed , EKG interpreted , Old EKG reviewed, Radiograph reviewed , Notes from prior ED visits and [place] Controlled Substance Database    Pertinent labs & imaging results that were available during my care of the patient  were reviewed by me and considered in my medical decision making (see chart for details).    ____________________________________________   FINAL CLINICAL IMPRESSION(S) / ED DIAGNOSES  Final diagnoses:  Bronchitis  Thoracic myofascial strain, initial encounter      NEW MEDICATIONS STARTED DURING THIS VISIT:  ED Discharge Orders        Ordered    predniSONE (DELTASONE) 20 MG tablet  Daily     04/16/17 2308    albuterol (PROVENTIL HFA;VENTOLIN HFA) 108 (90 Base) MCG/ACT inhaler  Every 6 hours PRN     04/16/17 2308    famotidine (PEPCID) 20 MG tablet  2 times daily     04/16/17 2308    ibuprofen (ADVIL,MOTRIN) 600 MG tablet  Every 6 hours PRN     04/16/17 2308       Note:  This document was prepared using Dragon voice recognition software and [place] include unintentional dictation errors.    Nita Sickle, MD 04/16/17 [phone]

## 2017-04-16 NOTE — ED Triage Notes (Addendum)
Pt to triage via w/c with no distress noted; pt reports pain to left upper [facility] st "when I take a breath I feel like I have to pass out"; mask placed on pt

## 2017-04-16 NOTE — Discharge Instructions (Addendum)
Take steroids and use inhaler as prescribed. Take ibuprofen or tylenol for the back pain. Return to the ER for worsening pain, chest pain, worsening shortness of breath, fever, chills, leg pain or swelling. [facility]

## 2017-09-09 ENCOUNTER — Encounter: Payer: Self-pay | Admitting: Emergency Medicine

## 2017-09-09 ENCOUNTER — Other Ambulatory Visit: Payer: Self-pay

## 2017-09-09 ENCOUNTER — Emergency Department
Admission: EM | Admit: 2017-09-09 | Discharge: 2017-09-09 | Disposition: A | Discharge status: Home / Self Care | Payer: Self-pay | Attending: Emergency Medicine | Admitting: Emergency Medicine

## 2017-09-09 ENCOUNTER — Emergency Department: Payer: Self-pay

## 2017-09-09 DIAGNOSIS — Z87891 Personal history of nicotine dependence: Secondary | ICD-10-CM | POA: Insufficient documentation

## 2017-09-09 DIAGNOSIS — Y929 Unspecified place or not applicable: Secondary | ICD-10-CM | POA: Insufficient documentation

## 2017-09-09 DIAGNOSIS — Y939 Activity, unspecified: Secondary | ICD-10-CM | POA: Insufficient documentation

## 2017-09-09 DIAGNOSIS — Y999 Unspecified external cause status: Secondary | ICD-10-CM | POA: Insufficient documentation

## 2017-09-09 DIAGNOSIS — S0990XA Unspecified injury of head, initial encounter: Secondary | ICD-10-CM | POA: Insufficient documentation

## 2017-09-09 MED ORDER — NAPROXEN 500 MG PO TABS
500.0000 mg | ORAL_TABLET | Freq: Once | ORAL | Status: AC
  Administered 2017-09-09: 500 mg via ORAL
  Filled 2017-09-09: qty 1

## 2017-09-09 MED ORDER — BACITRACIN ZINC 500 UNIT/GM EX OINT
TOPICAL_OINTMENT | Freq: Two times a day (BID) | CUTANEOUS | Status: DC
  Administered 2017-09-09: 18:00:00 via TOPICAL

## 2017-09-09 MED ORDER — NAPROXEN 500 MG PO TABS: 500.0000 mg | ORAL_TABLET | Freq: Two times a day (BID) | ORAL | Status: DC

## 2017-09-09 MED ORDER — BACITRACIN ZINC 500 UNIT/GM EX OINT
TOPICAL_OINTMENT | CUTANEOUS | Status: AC
  Filled 2017-09-09: qty 0.9

## 2017-09-09 NOTE — Discharge Instructions (Signed)
Follow discharge care instruction and take NSAIDs as needed for headache pain.
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
## 2017-09-09 NOTE — ED Notes (Signed)
Patient transported to CT 
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
## 2018-06-08 ENCOUNTER — Other Ambulatory Visit: Payer: Self-pay

## 2018-06-08 ENCOUNTER — Emergency Department
Admission: EM | Admit: 2018-06-08 | Discharge: 2018-06-09 | Disposition: A | Discharge status: Home / Self Care | Payer: Self-pay | Attending: Emergency Medicine | Admitting: Emergency Medicine

## 2018-06-08 ENCOUNTER — Emergency Department: Payer: Self-pay

## 2018-06-08 DIAGNOSIS — R45850 Homicidal ideations: Secondary | ICD-10-CM

## 2018-06-08 DIAGNOSIS — Z79899 Other long term (current) drug therapy: Secondary | ICD-10-CM | POA: Insufficient documentation

## 2018-06-08 DIAGNOSIS — F17210 Nicotine dependence, cigarettes, uncomplicated: Secondary | ICD-10-CM | POA: Insufficient documentation

## 2018-06-08 DIAGNOSIS — R4689 Other symptoms and signs involving appearance and behavior: Secondary | ICD-10-CM | POA: Insufficient documentation

## 2018-06-08 DIAGNOSIS — F319 Bipolar disorder, unspecified: Secondary | ICD-10-CM | POA: Insufficient documentation

## 2018-06-08 LAB — COMPREHENSIVE METABOLIC PANEL
ALT: 13 U/L (ref 0–44)
AST: 17 U/L (ref 15–41)
Albumin: 5.3 g/dL — ABNORMAL HIGH (ref 3.5–5.0)
Alkaline Phosphatase: 51 U/L (ref 38–126)
Anion gap: 8 (ref 5–15)
BUN: 7 mg/dL (ref 6–20)
CO2: 28 mmol/L (ref 22–32)
Calcium: 10 mg/dL (ref 8.9–10.3)
Chloride: 107 mmol/L (ref 98–111)
Creatinine, Ser: 0.97 mg/dL (ref 0.61–1.24)
GFR calc Af Amer: >60 mL/min (ref >60)
GFR calc non Af Amer: >60 mL/min (ref >60)
Glucose, Bld: 100 mg/dL — ABNORMAL HIGH (ref 70–99)
Potassium: 4.1 mmol/L (ref 3.5–5.1)
Sodium: 143 mmol/L (ref 135–145)
Total Bilirubin: 1.1 mg/dL (ref 0.3–1.2)
Total Protein: 7.7 g/dL (ref 6.5–8.1)

## 2018-06-08 LAB — SALICYLATE LEVEL: Salicylate Lvl: <7 mg/dL (ref 2.8–30.0)

## 2018-06-08 LAB — CBC
HCT: 47.1 % (ref 39.0–52.0)
Hemoglobin: 16.1 g/dL (ref 13.0–17.0)
MCH: 31.6 pg (ref 26.0–34.0)
MCHC: 34.2 g/dL (ref 30.0–36.0)
MCV: 92.4 fL (ref 80.0–100.0)
Platelets: 204 10*3/uL (ref 150–400)
RBC: 5.1 MIL/uL (ref 4.22–5.81)
RDW: 12.8 % (ref 11.5–15.5)
WBC: 12.4 10*3/uL — ABNORMAL HIGH (ref 4.0–10.5)
nRBC: 0 % (ref 0.0–0.2)

## 2018-06-08 LAB — URINE DRUG SCREEN, QUALITATIVE (ARMC ONLY)
Amphetamines, Ur Screen: NOT DETECTED
Barbiturates, Ur Screen: NOT DETECTED
Benzodiazepine, Ur Scrn: NOT DETECTED
Cannabinoid 50 Ng, Ur ~~LOC~~: POSITIVE — AB
Cocaine Metabolite,Ur ~~LOC~~: NOT DETECTED
MDMA (Ecstasy)Ur Screen: NOT DETECTED
Methadone Scn, Ur: NOT DETECTED
Opiate, Ur Screen: NOT DETECTED
Phencyclidine (PCP) Ur S: NOT DETECTED
Tricyclic, Ur Screen: NOT DETECTED

## 2018-06-08 LAB — CK: Total CK: 67 U/L (ref 49–397)

## 2018-06-08 LAB — ETHANOL: Alcohol, Ethyl (B): <10 mg/dL (ref <10)

## 2018-06-08 LAB — ACETAMINOPHEN LEVEL: Acetaminophen (Tylenol), Serum: <10 ug/mL — ABNORMAL LOW (ref 10–30)

## 2018-06-08 MED ORDER — TRAZODONE HCL 100 MG PO TABS
100.0000 mg | ORAL_TABLET | Freq: Every day | ORAL | Status: DC
  Administered 2018-06-08: 22:00:00 100 mg via ORAL
  Filled 2018-06-08: qty 1

## 2018-06-08 MED ORDER — LITHIUM CARBONATE ER 450 MG PO TBCR
450.0000 mg | EXTENDED_RELEASE_TABLET | Freq: Two times a day (BID) | ORAL | Status: DC
  Administered 2018-06-08 – 2018-06-09 (×2): 450 mg via ORAL
  Filled 2018-06-08 (×3): qty 1

## 2018-06-08 MED ORDER — LAMOTRIGINE 100 MG PO TABS
100.0000 mg | ORAL_TABLET | Freq: Every day | ORAL | Status: DC
  Administered 2018-06-08 – 2018-06-09 (×2): 100 mg via ORAL
  Filled 2018-06-08 (×2): qty 1

## 2018-06-08 MED ORDER — HYDROXYZINE HCL 25 MG PO TABS
50.0000 mg | ORAL_TABLET | Freq: Four times a day (QID) | ORAL | Status: DC | PRN
  Administered 2018-06-08: 22:00:00 50 mg via ORAL
  Filled 2018-06-08: qty 2

## 2018-06-08 MED ORDER — QUETIAPINE FUMARATE 25 MG PO TABS
100.0000 mg | ORAL_TABLET | Freq: Every day | ORAL | Status: DC
  Administered 2018-06-08: 22:00:00 100 mg via ORAL
  Filled 2018-06-08: qty 4
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
## 2018-06-08 NOTE — ED Notes (Signed)
Pt has black t-shirt,black pants,brown boots, red boxers, black socks, cell phones,and wallet.

## 2018-06-08 NOTE — ED Notes (Signed)
Per patient   Lithium 450 mg BID Lamictal 100 mg daily Seroquel 100 mg bedtime Trazodone 100 mg bedtime

## 2018-06-08 NOTE — ED Notes (Signed)
Patient said he is not happy being here but he will make the most of it, Clinical research associate informed him about some of the things he said about wanting to kill his mothers boyfriend are serious and that he has to take charge of his own life and let his mother handle hers because he is the one that keeps getting into trouble. Patient agreed but says he can go to his stepfathers house.
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
## 2018-06-08 NOTE — ED Notes (Signed)
Hourly rounding reveals patient in room. No complaints, stable, in no acute distress. Q15 minute rounds and monitoring via Rover and Officer to continue.   

## 2018-06-08 NOTE — ED Notes (Signed)
Hourly rounding reveals patient in room. Stable, in no acute distress. Q15 minute rounds and monitoring via Rover and Officer to continue.  

## 2018-06-08 NOTE — ED Notes (Signed)
Hourly rounding reveals patient sleeping in room. No complaints, stable, in no acute distress. Q15 minute rounds and monitoring via Rover and Officer to continue.  

## 2018-06-08 NOTE — ED Notes (Signed)
Hourly rounding reveals patient in room NP and TTS at bedside. No complaints, stable, in no acute distress. Q15 minute rounds and monitoring via Psychologist, counselling to continue.

## 2018-06-08 NOTE — ED Notes (Signed)
Report to include Situation, Background, Assessment, and Recommendations received from Jadeka RN. Patient alert and oriented, warm and dry, in no acute distress. Patient denies SI, HI, AVH and pain. Patient made aware of Q15 minute rounds and Rover and Officer presence for their safety. Patient instructed to come to me with needs or concerns.  
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
## 2018-06-08 NOTE — ED Notes (Signed)
IVC prior to arrival/ Consult ordered 

## 2018-06-09 DIAGNOSIS — Z789 Other specified health status: Secondary | ICD-10-CM

## 2018-06-09 DIAGNOSIS — Z72820 Sleep deprivation: Secondary | ICD-10-CM

## 2018-06-09 DIAGNOSIS — R45850 Homicidal ideations: Secondary | ICD-10-CM

## 2018-06-09 DIAGNOSIS — F3130 Bipolar disorder, current episode depressed, mild or moderate severity, unspecified: Secondary | ICD-10-CM

## 2018-06-09 DIAGNOSIS — F1210 Cannabis abuse, uncomplicated: Secondary | ICD-10-CM

## 2018-06-09 LAB — LITHIUM LEVEL: Lithium Lvl: 0.66 mmol/L (ref 0.60–1.20)
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
## 2018-06-09 NOTE — ED Notes (Signed)
Gave food tray with juice. 

## 2018-06-09 NOTE — ED Notes (Signed)
Hourly rounding reveals patient sleeping in room. No complaints, stable, in no acute distress. Q15 minute rounds and monitoring via Rover and Officer to continue.  

## 2018-06-09 NOTE — ED Notes (Signed)
BEHAVIORAL HEALTH ROUNDING Patient sleeping: No. Patient alert and oriented: yes Behavior appropriate: Yes.  ; If no, describe:  Nutrition and fluids offered: yes Toileting and hygiene offered: Yes  Sitter present: q15 minute observations and security  monitoring Law enforcement present: Yes  ODS  

## 2018-06-09 NOTE — ED Notes (Signed)
IVC PAPERS  RESCINDED  INFORMED  RN  AMY T AND  ODS

## 2018-06-09 NOTE — ED Notes (Signed)
Psychiatry is currently at his bedside

## 2018-06-09 NOTE — ED Notes (Signed)
ED Is the patient under IVC or is there intent for IVC: Yes.   Is the patient medically cleared: Yes.   Is there vacancy in the ED BHU: Yes.  Unit closed Is the population mix appropriate for patient: Yes.   Is the patient awaiting placement in inpatient or outpatient setting:   Has the patient had a psychiatric consult: NP overnight but she requests reassessment by the psychiatrist   Survey of unit performed for contraband, proper placement and condition of furniture, tampering with fixtures in bathroom, shower, and each patient room: Yes.  ; Findings:  APPEARANCE/BEHAVIOR Calm and cooperative NEURO ASSESSMENT Orientation: oriented x3  Denies pain Hallucinations: No.None noted (Hallucinations)  Denies at this time Speech: Normal Gait: normal RESPIRATORY ASSESSMENT Even  Unlabored respirations  CARDIOVASCULAR ASSESSMENT Pulses equal   regular rate  Skin warm and dry   GASTROINTESTINAL ASSESSMENT no GI complaint EXTREMITIES Full ROM  PLAN OF CARE Provide calm/safe environment. Vital signs assessed twice daily. ED BHU Assessment once each 12-hour shift. Collaborate with TTS if available or as condition indicates. Assure the ED provider has rounded once each shift. Provide and encourage hygiene. Provide redirection as needed. Assess for escalating behavior; address immediately and inform ED provider.  Assess family dynamic and appropriateness for visitation as needed: Yes.  ; If necessary, describe findings:  Educate the patient/family about BHU procedures/visitation: Yes.  ; If necessary, describe findings:

## 2018-06-09 NOTE — ED Provider Notes (Signed)
-----------------------------------------   11:58 AM on 06/09/2018 -----------------------------------------   Case discussed with psychiatry Dr. Rebecca Eaton who finds the patient to be stable for discharge home, follow-up with his outpatient psychiatry.  As noted by TTS, family does not have acute safety concerns and has secured firearms.  They report patient is in agreement with selling his firearm and not having any in the house.   Sharman Cheek, MD 06/09/18 1158

## 2018-06-09 NOTE — ED Notes (Signed)

## 2018-06-09 NOTE — ED Provider Notes (Signed)
-----------------------------------------   1:47 AM on 06/09/2018 -----------------------------------------   Blood pressure (!) 150/93, pulse 82, temperature 98.2 F (36.8 C), temperature source Oral, resp. rate 17, height 6' (1.829 m), weight 96.6 kg, SpO2 100 %.  The patient is calm and cooperative at this time.  There have been no acute events since the last update.  Awaiting disposition plan from Behavioral Medicine team.    Sharyn Creamer, MD 06/09/18 [phone]

## 2018-06-09 NOTE — ED Notes (Signed)
Patient observed lying in bed with eyes closed  Even, unlabored respirations observed   NAD pt appears to be sleeping  I will continue to monitor along with every 15 minute visual observations and ongoing security monitoring    

## 2018-06-09 NOTE — ED Notes (Signed)
Assessment completed  - plan of care discussed including reassessment by psychiatry this am  He denies pain
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
## 2018-11-16 ENCOUNTER — Encounter: Payer: Self-pay | Admitting: Physician Assistant

## 2018-11-16 ENCOUNTER — Ambulatory Visit: Payer: Self-pay | Admitting: Physician Assistant

## 2018-11-16 ENCOUNTER — Other Ambulatory Visit: Payer: Self-pay

## 2018-11-16 DIAGNOSIS — Z202 Contact with and (suspected) exposure to infections with a predominantly sexual mode of transmission: Secondary | ICD-10-CM

## 2018-11-16 DIAGNOSIS — Z299 Encounter for prophylactic measures, unspecified: Secondary | ICD-10-CM

## 2018-11-16 DIAGNOSIS — Z113 Encounter for screening for infections with a predominantly sexual mode of transmission: Secondary | ICD-10-CM

## 2018-11-16 LAB — GRAM STAIN

## 2018-11-16 MED ORDER — ACYCLOVIR 400 MG PO TABS: 400.0000 mg | ORAL_TABLET | Freq: Three times a day (TID) | ORAL | 0 refills | Status: AC

## 2018-11-16 NOTE — Progress Notes (Signed)
STI clinic/screening visit  Subjective:  Timothy Fields is a 28 y.o. male being seen today for an STI screening visit. The patient reports they do have symptoms.  Patient has the following medical conditions:   Patient Active Problem List   Diagnosis Date Noted  . 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 SEXUALLY TRANSMITTED DISEASE    HPI  Patient reports that he has had "really bad itching" in the pubic area for a few days.  Also, reports that he had what he thought might be a boil starting in his pubic area, but turned into a "group of bumps" in that spot.  States that his current partner was diagnosed with "the second stage of herpes" so he thinks that this is an outbreak that he is currently having.  Denies previous symptoms and known contact with any other partner with HSV.  See flowsheet for further details and programmatic requirements.    The following portions of the patient's history were reviewed and updated as appropriate: allergies, current medications, past medical history, past social history, past surgical history and problem list.  Objective:  There were no vitals filed for this visit.  Physical Exam Constitutional:      General: He is not in acute distress.    Appearance: Normal appearance.  HENT:     Head: Normocephalic and atraumatic.     Mouth/Throat:     Mouth: Mucous membranes are moist.     Pharynx: Oropharynx is clear. No oropharyngeal exudate or posterior oropharyngeal erythema.  Eyes:     Conjunctiva/sclera: Conjunctivae normal.  Neck:     Musculoskeletal: Neck supple.  Pulmonary:     Effort: Pulmonary effort is normal.  Abdominal:     Palpations: Abdomen is soft. There is no mass.     Tenderness: There is no abdominal tenderness. There is no guarding or rebound.  Genitourinary:    Penis: Normal.      Scrotum/Testes: Normal.     Comments: Pubic area without nits and lice. Edema and erythema around  scabbed cluster of possible shallow ulcers on L side at base of the penis. Shotty inguinal adenopathy bilaterally. Penis uncircumcised and without discharge at meatus. Lymphadenopathy:     Cervical: No cervical adenopathy.  Skin:    General: Skin is warm and dry.     Findings: No bruising, erythema, lesion or rash.  Neurological:     Mental Status: He is alert and oriented to person, place, and time.  Psychiatric:        Mood and Affect: Mood normal.        Thought Content: Thought content normal.        Judgment: Judgment normal.       Assessment and Plan:  Timothy Fields is a 28 y.o. male presenting to the [facility] Department for STI screening  1. Screening for STD (sexually transmitted disease) Patient into clinic for screening and with symptoms. Rec condoms with all sex. Await test results.  Counseled that RN will call if needs to RTC for further treatment once results are back. - Gram stain - Gonococcus culture - HIV [place] [facility] - Syphilis Serology, [place] [facility] - Virology, [facility]  2. Venereal disease contact Patient is a contact to HSV and having symptoms. HSV culture done today and awaiting results.   Counseled re:  HSV- dz, transmission, cyclic nature, subclinical dz and treatment options. - acyclovir (ZOVIRAX)  400 MG tablet; Take 1 tablet (400 mg total) by mouth 3 (three) times daily for 10 days.  Dispense: 30 tablet; Refill: 0  3. Prophylactic measure Will give Acyclovir 400mg  #30 1 po TID for 10 days for likely first HSV outbreak. No sex until lesions healed. - acyclovir (ZOVIRAX) 400 MG tablet; Take 1 tablet (400 mg total) by mouth 3 (three) times daily for 10 days.  Dispense: 30 tablet; Refill: 0     No follow-ups on file.  No future appointments.  Jerene Dilling, PA

## 2018-11-16 NOTE — Progress Notes (Signed)
Gram stain reviewed and no treatment needed for gram stain per standing order. Pt received Acyclovir #30 per provider order. Provider orders completed.Ronny Bacon, RN

## 2018-11-20 ENCOUNTER — Telehealth: Payer: Self-pay

## 2018-11-20 DIAGNOSIS — B009 Herpesviral infection, unspecified: Secondary | ICD-10-CM | POA: Insufficient documentation

## 2018-11-20 NOTE — Telephone Encounter (Signed)
Consulted by RN re: patient request for episodic tx for HSV.  OK to call in Acyclovir 800mg  #6 1 po TID x 2 days prn with refills for 1 year.  Reviewed RN documentation and agree with Rx as documented.

## 2018-11-20 NOTE — Telephone Encounter (Signed)
Acyclovir 800 mg #6 1 po TID for 2 days prn called into [place]. Per C. Hampton PA VO. Aileen Fass, RN

## 2018-11-20 NOTE — Telephone Encounter (Signed)
TC with patient.  Verified ID via password. Discussed +HSV 2 results, disease process and treatment options. Questions and concerns addressed. Requests Acyclovir be called into [place] Aileen Fass, RN

## 2018-11-21 LAB — GONOCOCCUS CULTURE

## 2018-12-21 ENCOUNTER — Other Ambulatory Visit: Payer: Self-pay | Admitting: Family Medicine

## 2018-12-21 ENCOUNTER — Other Ambulatory Visit: Payer: Self-pay | Admitting: Physician Assistant

## 2018-12-21 NOTE — Telephone Encounter (Signed)
TC from patient.  Requesting daily Acyclovir for HSV outbreak prevention. [facility] originally called in episodic regimen in October but patient has changed his mind.  Message sent to C. Marzetta Board FNP for approval. Aileen Fass, RN   [place]

## 2018-12-21 NOTE — Telephone Encounter (Signed)
Acyclovir 800mg  1 po qd #31, refills x 1 year per C. Marzetta Board FNP VO. Rx called into [place] Aileen Fass, RN

## 2018-12-21 NOTE — Telephone Encounter (Signed)
Wants to speak to nurse about treatment

## 2018-12-22 ENCOUNTER — Telehealth: Payer: Self-pay | Admitting: General Practice

## 2018-12-22 NOTE — Telephone Encounter (Signed)
TC with [place]. Rx for Acyclovir is ready for pickup.  Tc to patient to inform him of Rx ready Aileen Fass, RN

## 2018-12-22 NOTE — Telephone Encounter (Signed)
REFILL PRESCRIPTION

## 2018-12-24 NOTE — Telephone Encounter (Signed)
Patient called earlier this week requesting chane in his Acyclovir to daily regimen.  Acyclovir 800mg  1 po qd #31, refills x 1 year called into [place]- ok per C. Marzetta Board FNP VO Aileen Fass, RN

## 2019-08-16 IMAGING — CR DG CHEST 2V
2 series · 2 of 2 positions shown · non-contrast
Comparison: Two-view chest x-ray 02/17/2016

CLINICAL DATA: Left upper back pain for 1 week with shortness of
breath.

EXAM:
CHEST - 2 VIEW

[chest pa]
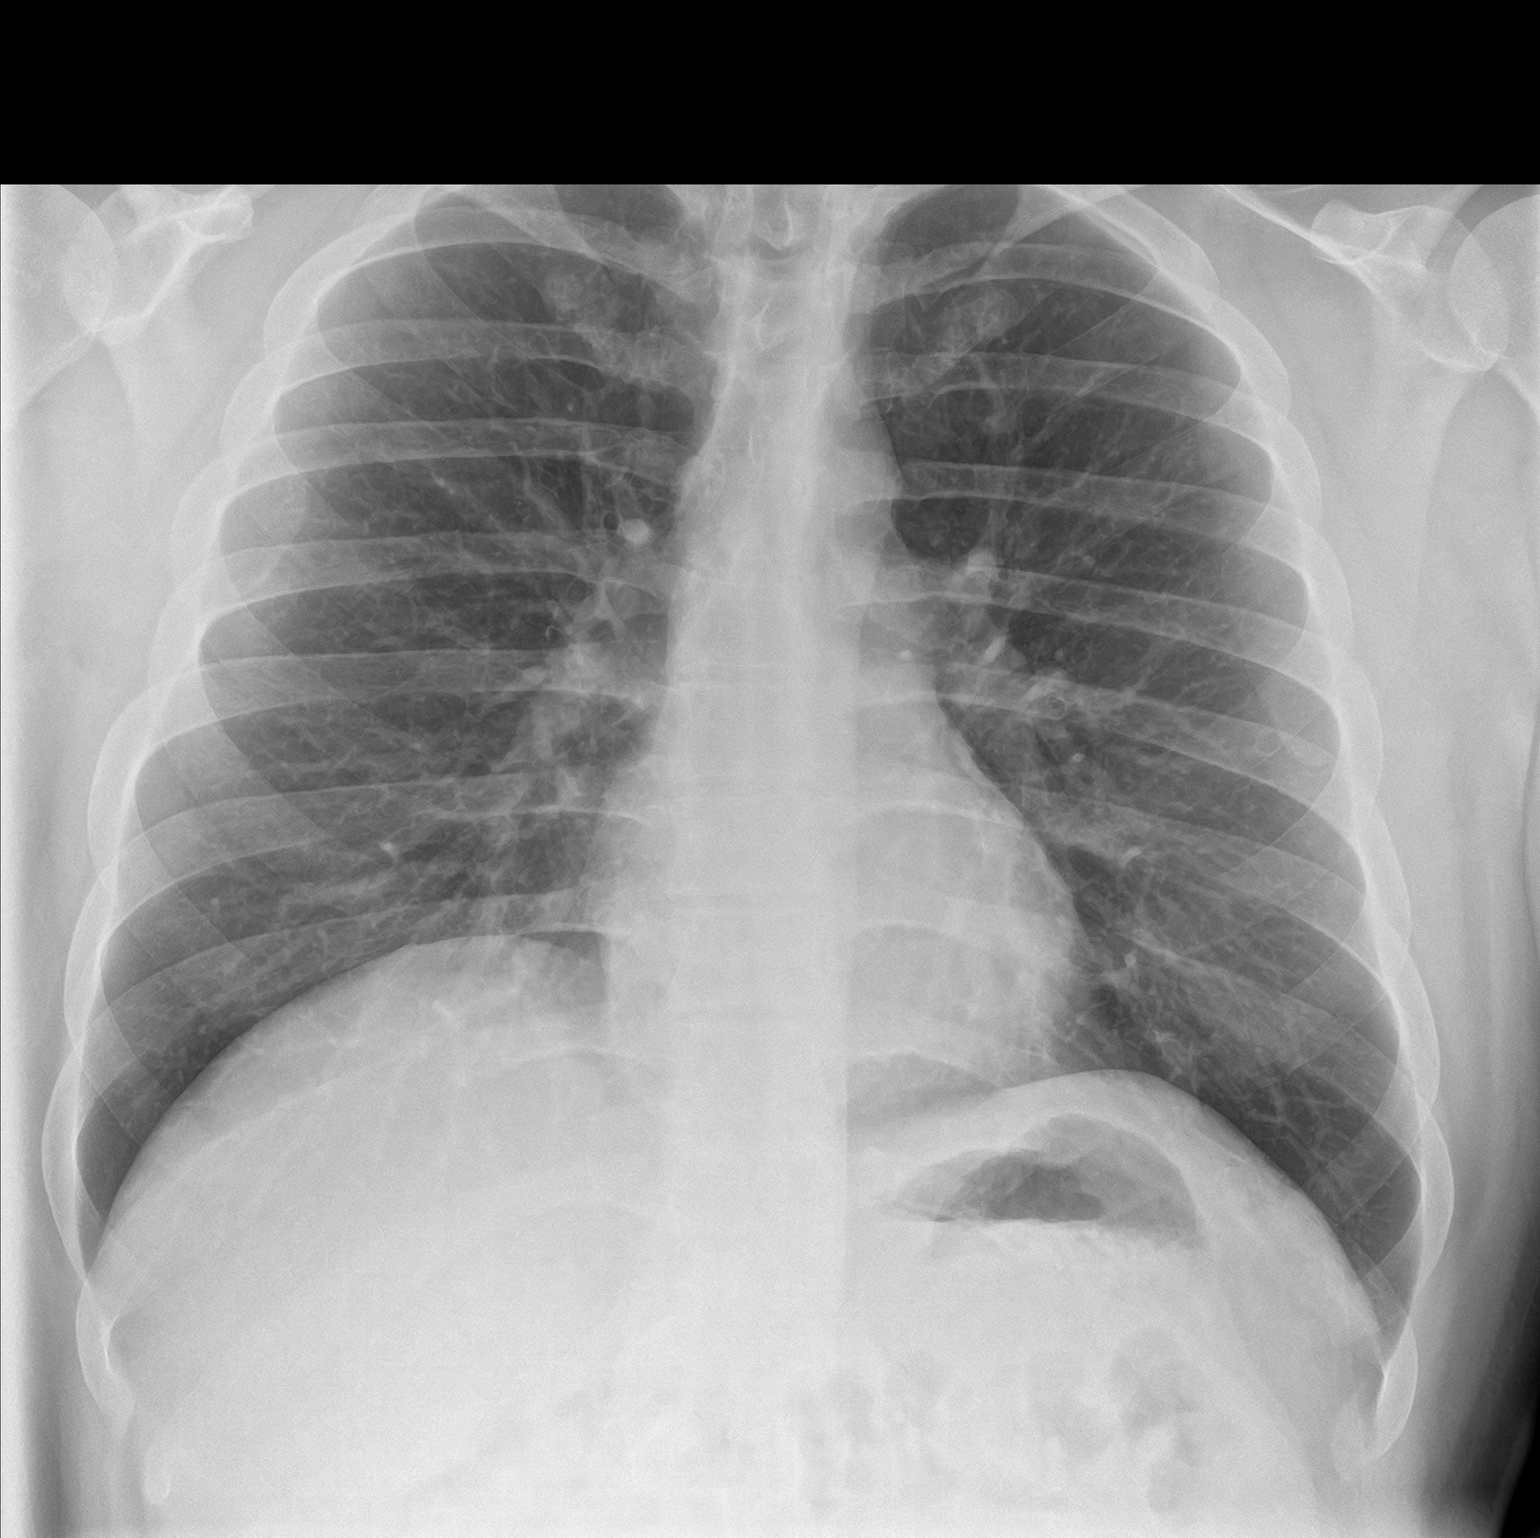

[chest lat]
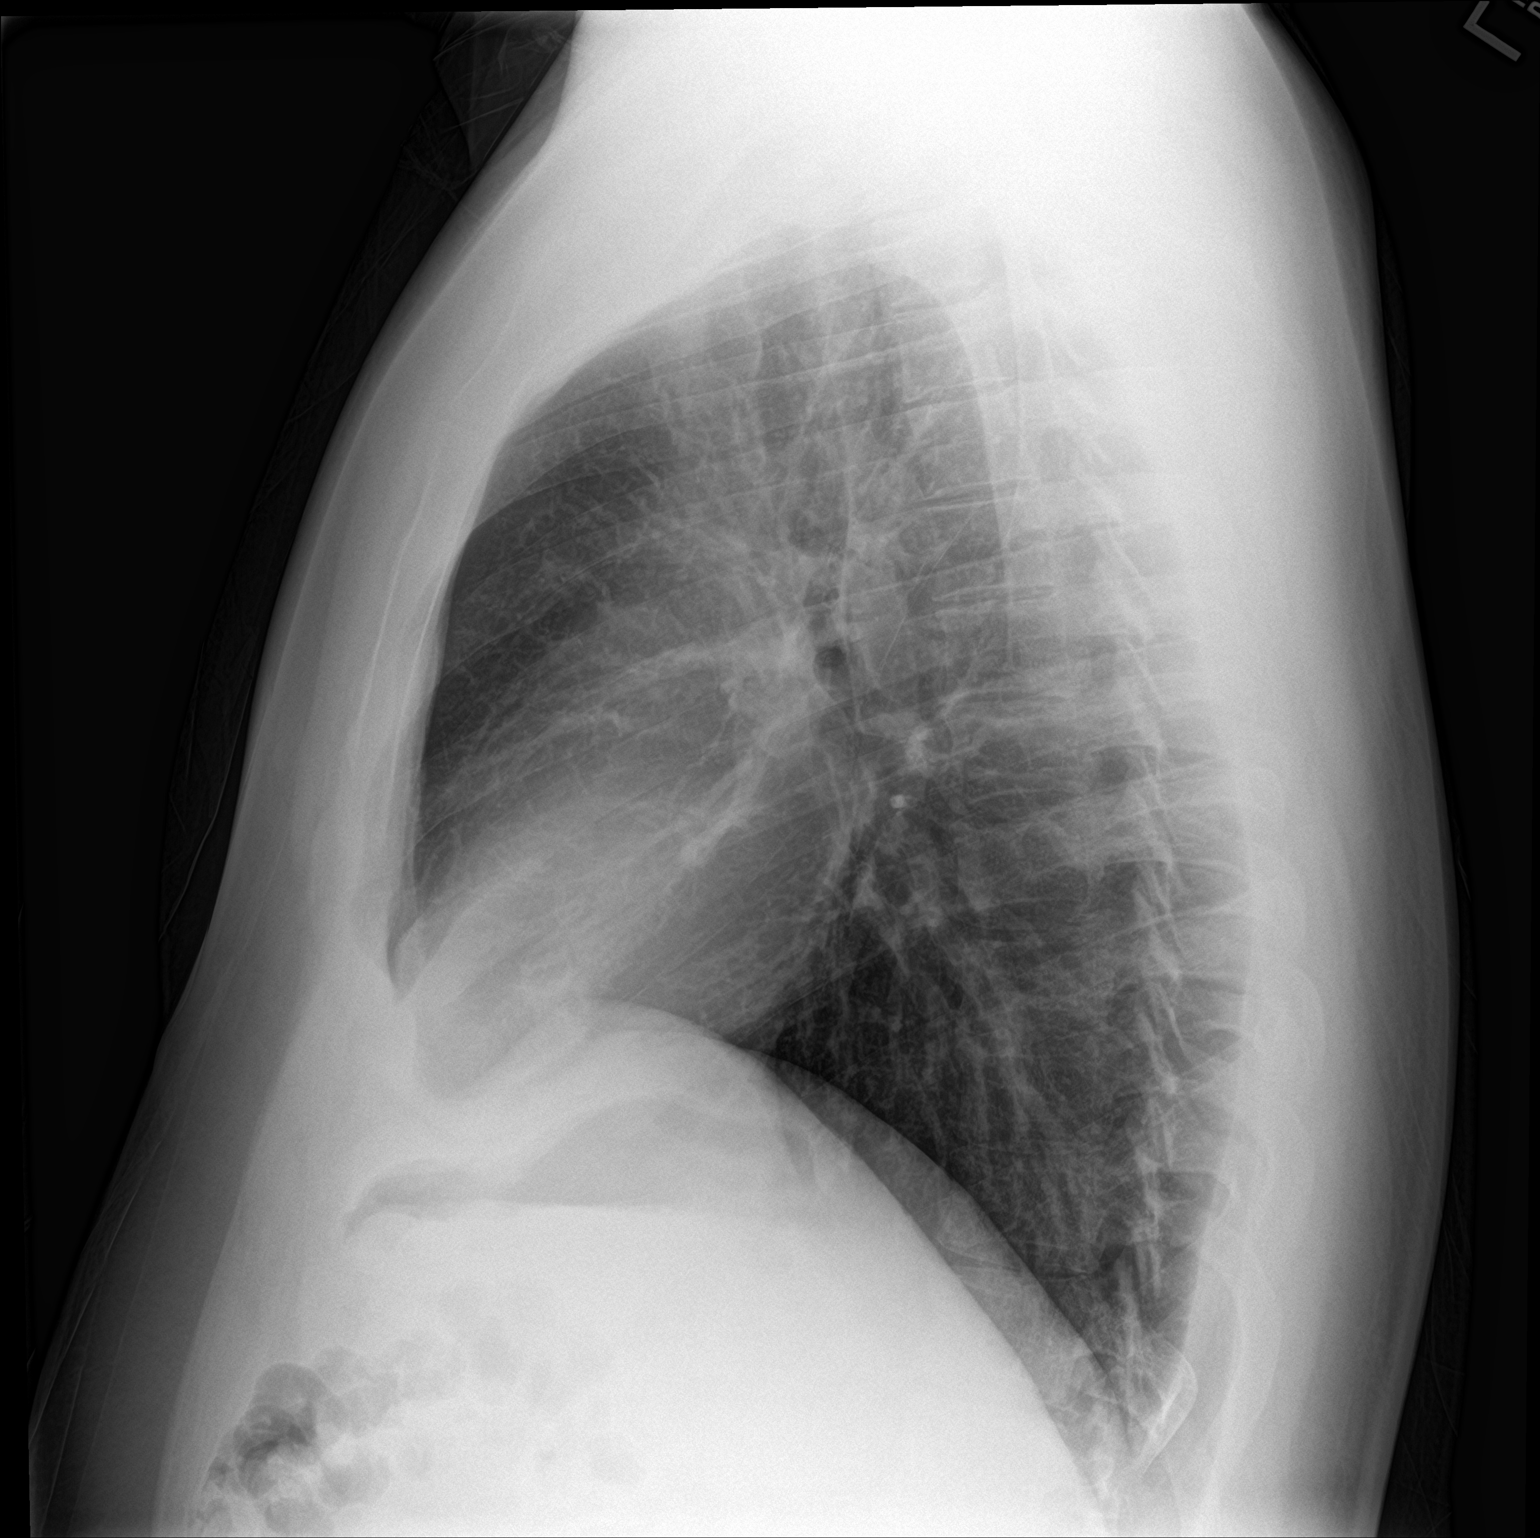

[2 of 2 positions shown; findings below may reference images not displayed]

FINDINGS: The heart size and mediastinal contours are within normal limits.
Both lungs are clear. The visualized skeletal structures are
unremarkable.
IMPRESSION: Negative two view chest x-ray

## 2019-08-25 ENCOUNTER — Emergency Department: Payer: Self-pay

## 2019-08-25 ENCOUNTER — Other Ambulatory Visit: Payer: Self-pay

## 2019-08-25 ENCOUNTER — Emergency Department
Admission: EM | Admit: 2019-08-25 | Discharge: 2019-08-25 | Disposition: A | Discharge status: Home / Self Care | Payer: Self-pay | Attending: Emergency Medicine | Admitting: Emergency Medicine

## 2019-08-25 DIAGNOSIS — R109 Unspecified abdominal pain: Secondary | ICD-10-CM | POA: Insufficient documentation

## 2019-08-25 DIAGNOSIS — F17210 Nicotine dependence, cigarettes, uncomplicated: Secondary | ICD-10-CM | POA: Insufficient documentation

## 2019-08-25 LAB — URINALYSIS, COMPLETE (UACMP) WITH MICROSCOPIC
Bacteria, UA: NONE SEEN
Bilirubin Urine: NEGATIVE
Glucose, UA: NEGATIVE mg/dL
Hgb urine dipstick: NEGATIVE
Ketones, ur: NEGATIVE mg/dL
Leukocytes,Ua: NEGATIVE
Nitrite: NEGATIVE
Protein, ur: NEGATIVE mg/dL
Specific Gravity, Urine: 1.019 (ref 1.005–1.030)
pH: 5 (ref 5.0–8.0)

## 2019-08-25 LAB — CBC
HCT: 42.2 % (ref 39.0–52.0)
Hemoglobin: 15.4 g/dL (ref 13.0–17.0)
MCH: 32.9 pg (ref 26.0–34.0)
MCHC: 36.5 g/dL — ABNORMAL HIGH (ref 30.0–36.0)
MCV: 90.2 fL (ref 80.0–100.0)
Platelets: 200 10*3/uL (ref 150–400)
RBC: 4.68 MIL/uL (ref 4.22–5.81)
RDW: 13 % (ref 11.5–15.5)
WBC: 6.9 10*3/uL (ref 4.0–10.5)
nRBC: 0 % (ref 0.0–0.2)

## 2019-08-25 LAB — HEPATIC FUNCTION PANEL
ALT: 14 U/L (ref 0–44)
AST: 17 U/L (ref 15–41)
Albumin: 4.6 g/dL (ref 3.5–5.0)
Alkaline Phosphatase: 43 U/L (ref 38–126)
Bilirubin, Direct: 0.2 mg/dL (ref 0.0–0.2)
Indirect Bilirubin: 0.9 mg/dL (ref 0.3–0.9)
Total Bilirubin: 1.1 mg/dL (ref 0.3–1.2)
Total Protein: 7.1 g/dL (ref 6.5–8.1)

## 2019-08-25 LAB — BASIC METABOLIC PANEL
Anion gap: 8 (ref 5–15)
BUN: 8 mg/dL (ref 6–20)
CO2: 26 mmol/L (ref 22–32)
Calcium: 9.4 mg/dL (ref 8.9–10.3)
Chloride: 107 mmol/L (ref 98–111)
Creatinine, Ser: 0.94 mg/dL (ref 0.61–1.24)
GFR calc Af Amer: >60 mL/min (ref >60)
GFR calc non Af Amer: >60 mL/min (ref >60)
Glucose, Bld: 91 mg/dL (ref 70–99)
Potassium: 4.4 mmol/L (ref 3.5–5.1)
Sodium: 141 mmol/L (ref 135–145)

## 2019-08-25 LAB — LIPASE, BLOOD: Lipase: 23 U/L (ref 11–51)

## 2019-08-25 MED ORDER — OXYCODONE HCL 5 MG PO TABS
5.0000 mg | ORAL_TABLET | Freq: Once | ORAL | Status: AC
Start: 2019-08-25 — End: 2019-08-25
  Administered 2019-08-25: 5 mg via ORAL
  Filled 2019-08-25: qty 1

## 2019-08-25 MED ORDER — ONDANSETRON 4 MG PO TBDP
4.0000 mg | ORAL_TABLET | Freq: Once | ORAL | Status: AC
  Administered 2019-08-25: 4 mg via ORAL
  Filled 2019-08-25: qty 1

## 2019-08-25 NOTE — ED Provider Notes (Signed)
[facility] Emergency Department Provider Note  Time seen: 1:25 PM  I have reviewed the triage vital signs and the nursing notes.   HISTORY  Chief Complaint Hematuria and Flank Pain   HPI Timothy Fields is a 29 y.o. male with a past medical history of back pain presents to the emergency department for back pain as well as blood in his stool and urine.  According to the patient for the past 2 days he has been experiencing bilateral back pain/flank pain.  He states his urine has been very dark and he thinks there Ducey have been blood in it.  He also states when he had a bowel movement this morning he thought he saw blood in the toilet.  Patient denies any anterior abdominal pain.  Denies any nausea vomiting or diarrhea.  Denies any dark or black stool.   Past Medical History:  Diagnosis Date  . Back pain     Patient Active Problem List   Diagnosis Date Noted  . 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 No pertinent surgical history.  Prior to Admission medications   Medication Sig Start Date End Date Taking? Authorizing Provider  lamoTRIgine (LAMICTAL) 100 MG tablet Take 100 mg by mouth daily. 06/04/18   [provider]  lithium carbonate (ESKALITH) 450 MG CR tablet Take 450 mg by mouth 2 (two) times daily. 06/04/18   [provider]  QUEtiapine (SEROQUEL) 100 MG tablet Take 100 mg by mouth at bedtime. 06/04/18   [provider]  traZODone (DESYREL) 100 MG tablet Take 100 mg by mouth at bedtime. 06/04/18   [provider]    Allergies  Allergen Reactions  . Tramadol Hives  . Ibuprofen Other (See Comments)    Gi upset   . Tylenol [Acetaminophen] Itching    History reviewed. No pertinent family history.  Social History Social History   Tobacco Use  . Smoking status: Current Every Day Smoker    Packs/day: 2.00    Types: Cigarettes  . Smokeless tobacco: Never Used  Substance Use  Topics  . Alcohol use: No  . Drug use: Yes    Types: Marijuana    Review of Systems Constitutional: Negative for fever. Cardiovascular: Negative for chest pain. Respiratory: Negative for shortness of breath. Gastrointestinal: Patient states bilateral flank pain for the past 2 days.  Negative for vomiting or diarrhea.  Possible blood in stool this morning. Genitourinary: Dark urine. Musculoskeletal: Negative for musculoskeletal complaints Neurological: Negative for headache All other ROS negative  ____________________________________________   PHYSICAL EXAM:  VITAL SIGNS: ED Triage Vitals  Enc Vitals Group     BP 08/25/19 1216 (!) 142/85     Pulse Rate 08/25/19 1216 71     Resp 08/25/19 1216 18     Temp 08/25/19 1216 98.6 F (37 C)     Temp Source 08/25/19 1216 Oral     SpO2 08/25/19 1216 99 %     Weight 08/25/19 1217 220 lb (99.8 kg)     Height --      Head Circumference --      Peak Flow --      Pain Score 08/25/19 1217 7     Pain Loc --      Pain Edu? --      Excl. in [place]? --     Constitutional: Alert and oriented. Well appearing and in no distress. Eyes: Normal exam ENT  Head: Normocephalic and atraumatic.      Mouth/Throat: Mucous membranes are moist. Cardiovascular: Normal rate, regular rhythm.  Respiratory: Normal respiratory effort without tachypnea nor retractions. Breath sounds are clear Gastrointestinal: Soft and nontender. No distention.  Musculoskeletal: Nontender with normal range of motion in all extremities. Neurologic:  Normal speech and language. No gross focal neurologic deficits  Skin:  Skin is warm, dry and intact.  Psychiatric: Mood and affect are normal.   ____________________________________________   RADIOLOGY  IMPRESSION:  No acute abnormality or findings to account for reported symptoms.   ____________________________________________   INITIAL IMPRESSION / ASSESSMENT AND PLAN / ED COURSE  Pertinent labs & imaging results  that were available during my care of the patient were reviewed by me and considered in my medical decision making (see chart for details).   Patient presents to the emergency department multiple complaints including bilateral flank pain for the past 2 days dark urine and possible blood in his stool this morning.  Rectal examination performed by myself shows light brown stool guaiac negative does have a small hemorrhoid which could possibly have some intermittent bleeding.  Patient's urinalysis appears largely within normal limits, no red or white cells seen on urinalysis.  Basic lab work is also largely within normal limits.  I have added on LFTs to evaluate total bilirubin.  We will also obtain a CT renal scan to rule out kidney stone.  Patient agreeable to plan of care.  Patient's work-up is essentially nonrevealing.  CT scan negative.  LFTs and lipase are normal.  Urinalysis is normal.  We will discharge patient home with PCP follow-up.  Patient agreeable to plan of care.  [name] was evaluated in Emergency Department on 08/25/2019 for the symptoms described in the history of present illness. He was evaluated in the context of the global COVID-19 pandemic, which necessitated consideration that the patient might be at risk for infection with the SARS-CoV-2 virus that causes COVID-19. Institutional protocols and algorithms that pertain to the evaluation of patients at risk for COVID-19 are in a state of rapid change based on information released by regulatory bodies including the CDC and federal and state organizations. These policies and algorithms were followed during the patient's care in the ED.  ____________________________________________   FINAL CLINICAL IMPRESSION(S) / ED DIAGNOSES  Flank pain   Minna Antis, MD 08/25/19 1358

## 2019-08-25 NOTE — ED Triage Notes (Signed)
Pt arrives via pov with c/o right side flank pain, blood in urine, burning and pain with urination starting this morning. Reports pain in genital region. Pt also reports dark red blood in stool. NAD noted at this time.

## 2019-08-25 NOTE — ED Notes (Signed)
Pt states his father is coming to pick him up. Pt verbalizes understanding that he is not to drive for 4-6 hours post oxycodone administration.

## 2020-01-11 ENCOUNTER — Encounter: Payer: Self-pay | Admitting: Physician Assistant

## 2020-01-11 ENCOUNTER — Ambulatory Visit: Payer: Self-pay

## 2020-01-11 ENCOUNTER — Ambulatory Visit: Payer: Self-pay | Admitting: Physician Assistant

## 2020-01-11 ENCOUNTER — Other Ambulatory Visit: Payer: Self-pay

## 2020-01-11 DIAGNOSIS — Z113 Encounter for screening for infections with a predominantly sexual mode of transmission: Secondary | ICD-10-CM

## 2020-01-11 DIAGNOSIS — B009 Herpesviral infection, unspecified: Secondary | ICD-10-CM

## 2020-01-11 MED ORDER — ACYCLOVIR 800 MG PO TABS: 800.0000 mg | ORAL_TABLET | Freq: Every day | ORAL | 3 refills | Status: DC

## 2020-01-11 NOTE — Progress Notes (Signed)
S:  Patient into clinic requesting renewal of Rx for Acyclovir for suppressive treatment of HSV.  Denies any changes to his history since his visit last year.  Declines exam/screening today.  Reports allergy to Tylenol, Ibuprofen and Tramadol. O:  WDWN male in NAD, A&O x 3; normal work of breathing. A/P:  1.  Patient with history of HSV and requesting Rx for suppressive treatment. 2.  Patient requests that Rx be sent electronically to [facility] on [address]. 3.  Acyclovir 800 mg #90 1 po daily with refills x 3 sent to patient's pharmacy of choice. 4.  Enc condoms with all sex for STD protection. 5.  RTC prn and in 1 year.

## 2021-08-13 ENCOUNTER — Ambulatory Visit: Payer: Self-pay

## 2021-09-25 ENCOUNTER — Ambulatory Visit: Payer: Self-pay

## 2021-12-24 ENCOUNTER — Other Ambulatory Visit: Payer: Self-pay | Admitting: Nurse Practitioner

## 2021-12-24 ENCOUNTER — Telehealth: Payer: Self-pay | Admitting: Family Medicine

## 2021-12-24 DIAGNOSIS — B009 Herpesviral infection, unspecified: Secondary | ICD-10-CM

## 2021-12-24 IMAGING — CT CT RENAL STONE PROTOCOL
2 of 4 series · 16 of 46 positions shown, 18 images · non-contrast
Comparison: None.

CLINICAL DATA: Right flank pain

EXAM:
CT ABDOMEN AND PELVIS WITHOUT CONTRAST
TECHNIQUE: Multidetector CT imaging of the abdomen and pelvis was performed
following the standard protocol without IV contrast.

[Series 2: stone full standard · axial · 0.73mm/px · z∈[-876,-406]mm · 13 of 104 slices shown, 15 images]
[im 5/104  soft-tissue]
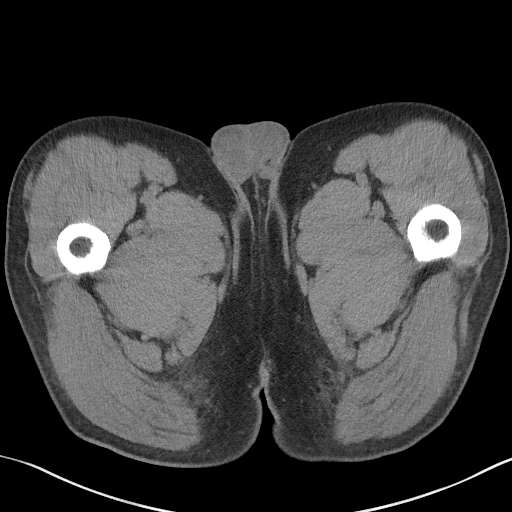
[im 5/104  bone]
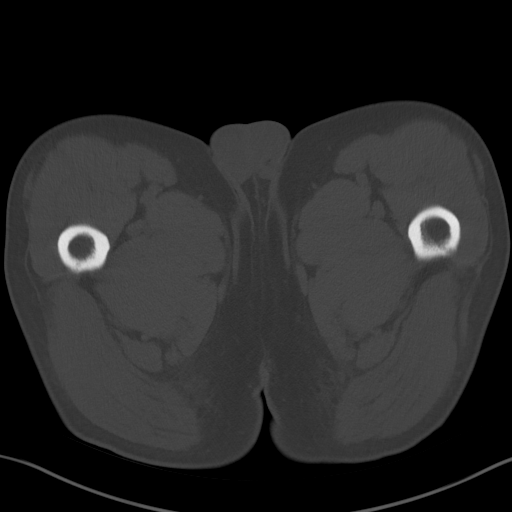
[im 14/104  soft-tissue]
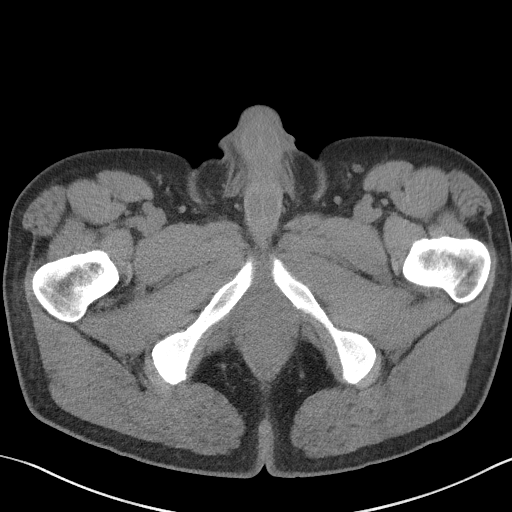
[im 23/104  soft-tissue]
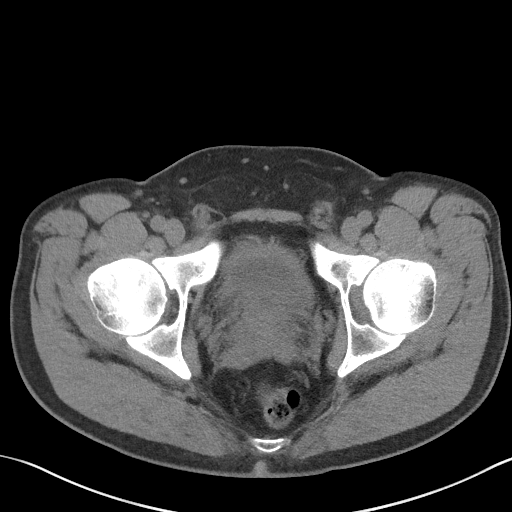
[im 27/104  soft-tissue]
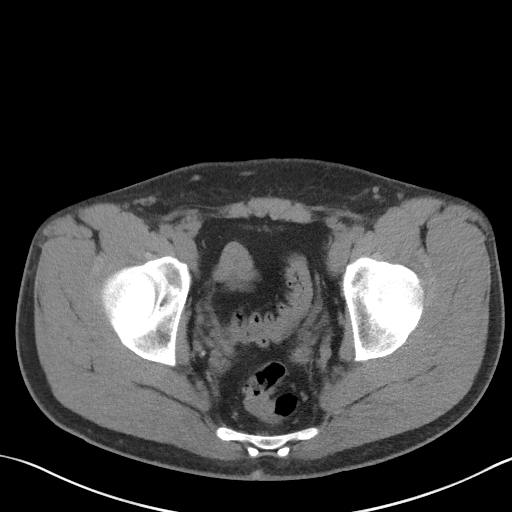
[im 36/104  soft-tissue]
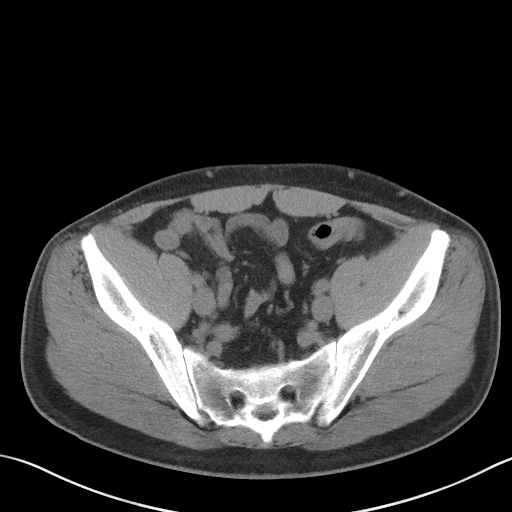
[im 45/104  soft-tissue]
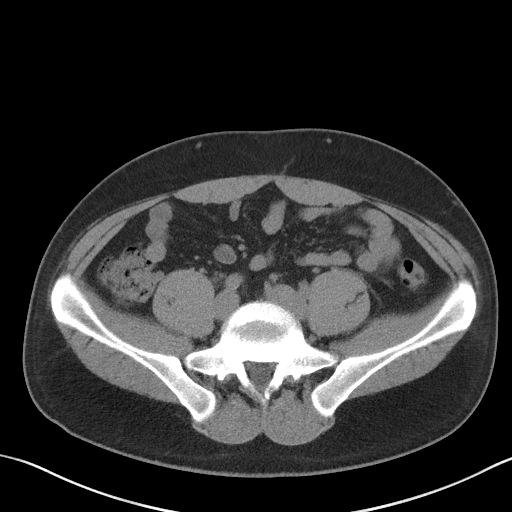
[im 54/104  soft-tissue]
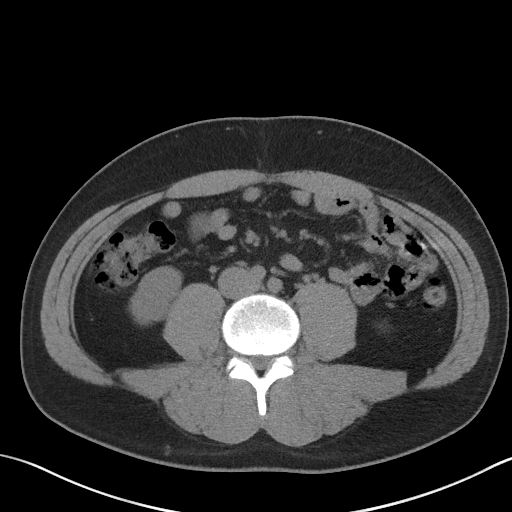
[im 59/104  soft-tissue]
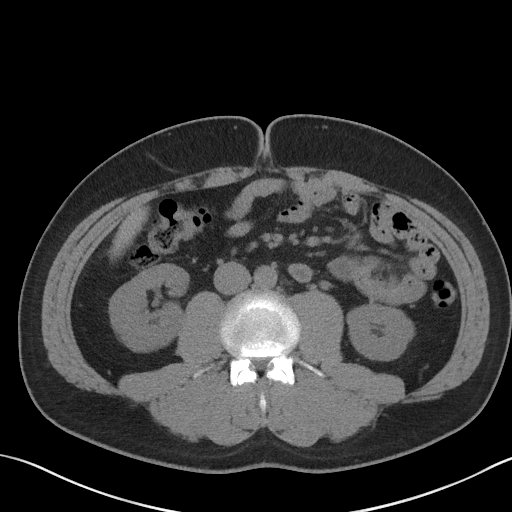
[im 68/104  soft-tissue]
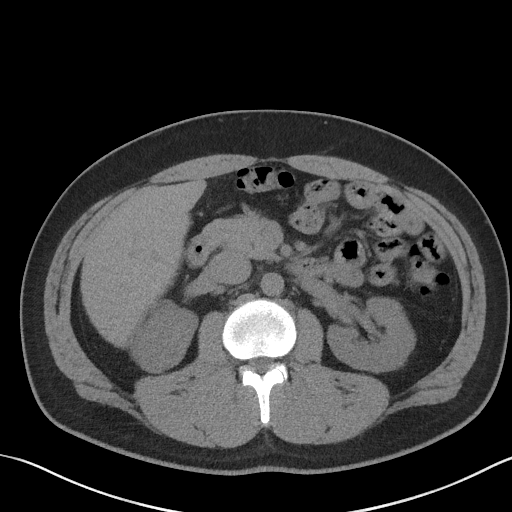
[im 68/104  bone]
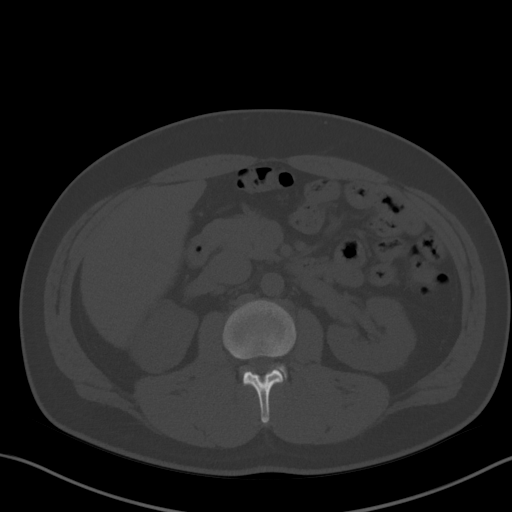
[im 77/104  soft-tissue]
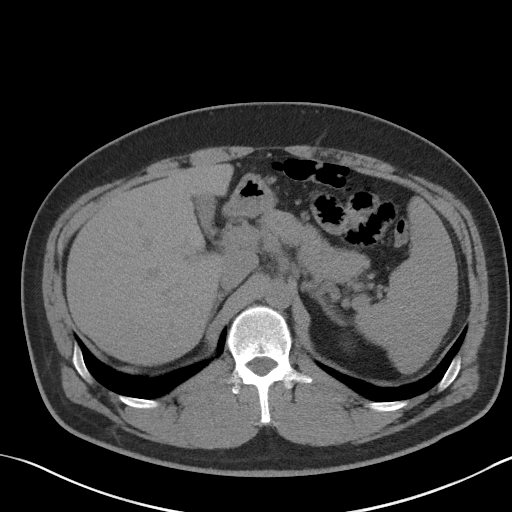
[im 81/104  soft-tissue]
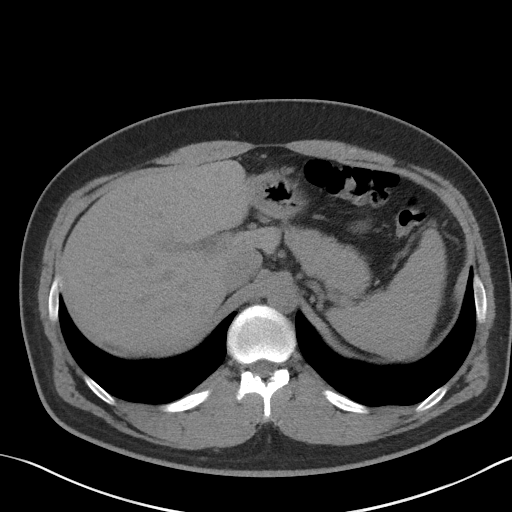
[im 90/104  soft-tissue]
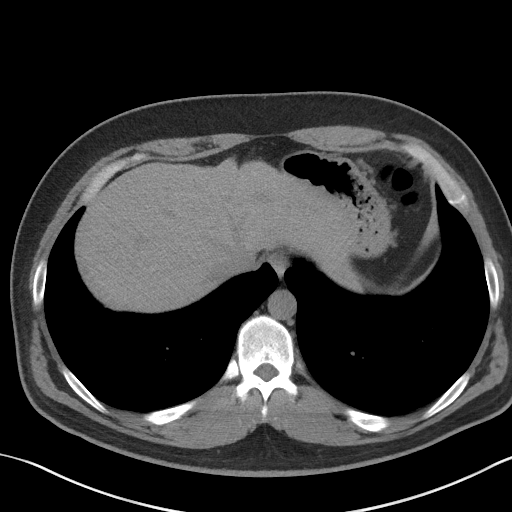
[im 99/104  soft-tissue]
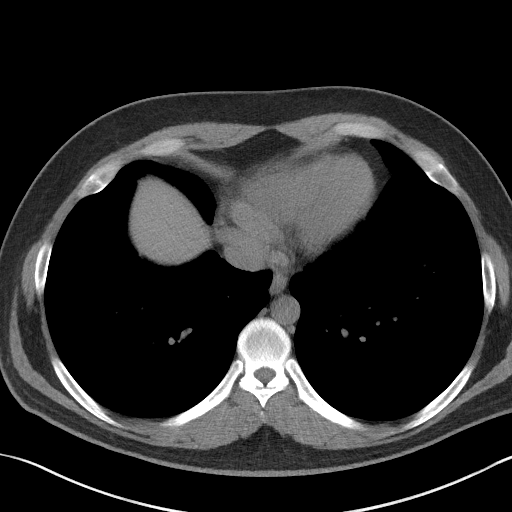

[Series 5: coronal · coronal · 0.80mm/px · 3 of 145 slices shown]
[im 49/145  soft-tissue]
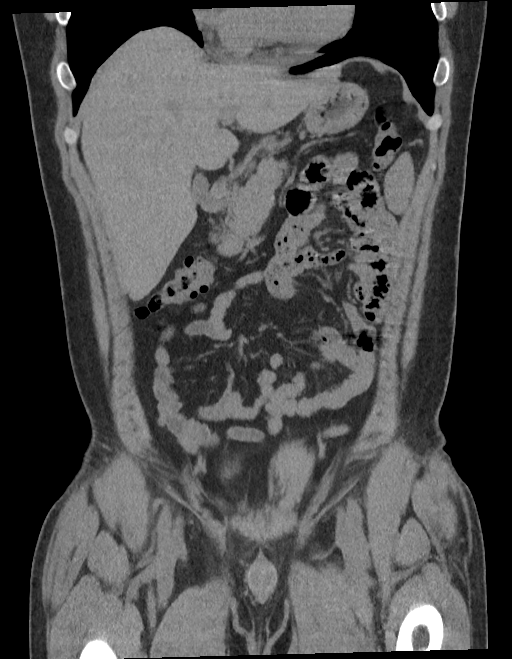
[im 65/145  soft-tissue]
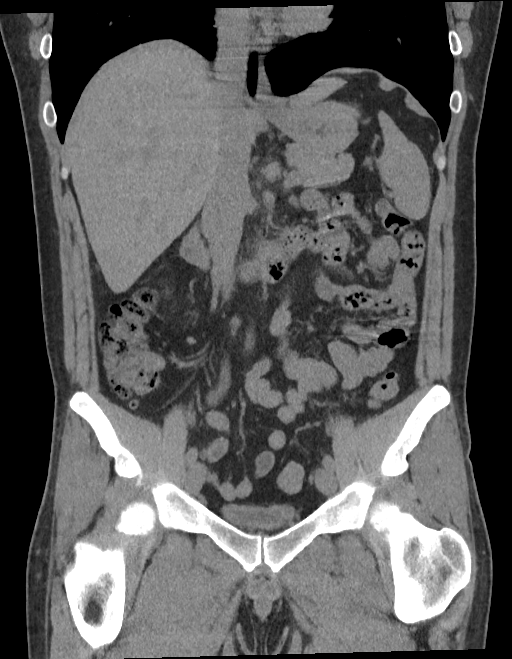
[im 81/145  soft-tissue]
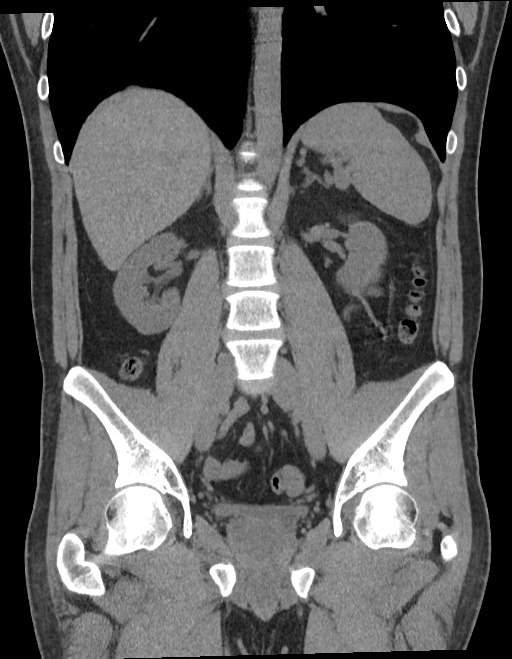

[16 of 46 positions shown; findings below may reference images not displayed]

FINDINGS: Lower chest: No acute abnormality.

Hepatobiliary: No focal liver abnormality is seen. No gallstones,
gallbladder wall thickening, or biliary dilatation.

Pancreas: Unremarkable.

Spleen: Unremarkable.

Adrenals/Urinary Tract: Adrenals are unremarkable. No renal calculi
or hydronephrosis. There is no hydroureter. Bladder is poorly
distended but unremarkable.

Stomach/Bowel: Stomach is within normal limits. Bowel is normal in
caliber. Normal appendix.

Vascular/Lymphatic: No significant vascular findings on this
noncontrast study. There are no enlarged nodes identified.

Reproductive: Unremarkable.

Other: No ascites. There is no significant abdominal wall
abnormality

Musculoskeletal: No acute osseous abnormality.
IMPRESSION: No acute abnormality or findings to account for reported symptoms.

## 2021-12-24 MED ORDER — ACYCLOVIR 800 MG PO TABS: 800.0000 mg | ORAL_TABLET | Freq: Two times a day (BID) | ORAL | 0 refills | Status: AC

## 2021-12-24 NOTE — Progress Notes (Signed)
Prescription refill request for Acyclovir.  Patient has a return appointment for 01/09/22.  Glenna Fellows, FNP

## 2021-12-24 NOTE — Telephone Encounter (Signed)
Pt calls & states he is out of Acyclovir & is in the middle of a herpes outbreak. Requests a refill to [facility] [address]. He did schedule an STI appt for the first available, 01/09/22. Please advise.

## 2022-01-09 ENCOUNTER — Telehealth: Payer: Self-pay | Admitting: Family Medicine

## 2022-01-09 ENCOUNTER — Ambulatory Visit: Payer: Self-pay

## 2022-01-09 NOTE — Telephone Encounter (Signed)
Patient overslept and will not make it for his appointment. Patient states that he needs a refill on his medication. Sending note per C. Gerilyn Pilgrim.

## 2022-01-10 ENCOUNTER — Other Ambulatory Visit: Payer: Self-pay | Admitting: Nurse Practitioner

## 2022-01-10 DIAGNOSIS — B009 Herpesviral infection, unspecified: Secondary | ICD-10-CM

## 2022-01-10 MED ORDER — ACYCLOVIR 800 MG PO TABS: 800.0000 mg | ORAL_TABLET | Freq: Every day | ORAL | 0 refills | Status: AC

## 2022-01-10 NOTE — Progress Notes (Signed)
Prescription refill submitted. Glenna Fellows, FNP   1. HSV-2 infection - acyclovir (ZOVIRAX) 800 MG tablet; Take 1 tablet (800 mg total) by mouth daily.  Dispense: 30 tablet; Refill: 0

## 2022-02-27 ENCOUNTER — Emergency Department
Admission: EM | Admit: 2022-02-27 | Discharge: 2022-02-27 | Disposition: A | Discharge status: Home / Self Care | Payer: Self-pay | Attending: Emergency Medicine | Admitting: Emergency Medicine

## 2022-02-27 ENCOUNTER — Other Ambulatory Visit: Payer: Self-pay

## 2022-02-27 ENCOUNTER — Emergency Department: Payer: Self-pay

## 2022-02-27 DIAGNOSIS — R55 Syncope and collapse: Secondary | ICD-10-CM | POA: Insufficient documentation

## 2022-02-27 DIAGNOSIS — R569 Unspecified convulsions: Secondary | ICD-10-CM | POA: Insufficient documentation

## 2022-02-27 DIAGNOSIS — R4020 Unspecified coma: Secondary | ICD-10-CM

## 2022-02-27 LAB — COMPREHENSIVE METABOLIC PANEL
ALT: 16 U/L (ref 0–44)
AST: 26 U/L (ref 15–41)
Albumin: 4.5 g/dL (ref 3.5–5.0)
Alkaline Phosphatase: 48 U/L (ref 38–126)
Anion gap: 9 (ref 5–15)
BUN: 12 mg/dL (ref 6–20)
CO2: 24 mmol/L (ref 22–32)
Calcium: 9 mg/dL (ref 8.9–10.3)
Chloride: 104 mmol/L (ref 98–111)
Creatinine, Ser: 1.13 mg/dL (ref 0.61–1.24)
GFR, Estimated: >60 mL/min (ref >60)
Glucose, Bld: 148 mg/dL — ABNORMAL HIGH (ref 70–99)
Potassium: 3.5 mmol/L (ref 3.5–5.1)
Sodium: 137 mmol/L (ref 135–145)
Total Bilirubin: 1.7 mg/dL — ABNORMAL HIGH (ref 0.3–1.2)
Total Protein: 7.1 g/dL (ref 6.5–8.1)

## 2022-02-27 LAB — CBC WITH DIFFERENTIAL/PLATELET
Abs Immature Granulocytes: 0.02 10*3/uL (ref 0.00–0.07)
Basophils Absolute: 0.1 10*3/uL (ref 0.0–0.1)
Basophils Relative: 1 %
Eosinophils Absolute: 0.2 10*3/uL (ref 0.0–0.5)
Eosinophils Relative: 3 %
HCT: 44.4 % (ref 39.0–52.0)
Hemoglobin: 16 g/dL (ref 13.0–17.0)
Immature Granulocytes: 0 %
Lymphocytes Relative: 31 %
Lymphs Abs: 2 10*3/uL (ref 0.7–4.0)
MCH: 32.2 pg (ref 26.0–34.0)
MCHC: 36 g/dL (ref 30.0–36.0)
MCV: 89.3 fL (ref 80.0–100.0)
Monocytes Absolute: 0.3 10*3/uL (ref 0.1–1.0)
Monocytes Relative: 5 %
Neutro Abs: 3.9 10*3/uL (ref 1.7–7.7)
Neutrophils Relative %: 60 %
Platelets: 179 10*3/uL (ref 150–400)
RBC: 4.97 MIL/uL (ref 4.22–5.81)
RDW: 12.5 % (ref 11.5–15.5)
WBC: 6.4 10*3/uL (ref 4.0–10.5)
nRBC: 0 % (ref 0.0–0.2)

## 2022-02-27 LAB — TROPONIN I (HIGH SENSITIVITY): Troponin I (High Sensitivity): <2 ng/L (ref <18)

## 2022-02-27 NOTE — ED Notes (Signed)
Pt verbalized understanding of DC instructions. Signing pad did not work.   

## 2022-02-27 NOTE — ED Notes (Signed)
Patient transported to CT 

## 2022-02-27 NOTE — Discharge Instructions (Addendum)
It is possible that you had a seizure versus just an episode of loss of consciousness.  Your blood work and CAT scans are reassuring.  Please follow-up with the primary care provider.  If you develop any new symptoms such as worsening abdominal pain or chest pain please return to the emergency department.

## 2022-02-27 NOTE — ED Notes (Signed)
ED Provider at bedside. 

## 2022-02-27 NOTE — ED Triage Notes (Addendum)
Pt presents to ER via ems after having a syncopal episode and, and falling while at grocery store.  Ems states pt's wife saw pt fall to ground, and have some seizure like activity - no seizure hx.  Pt states he felt his vision go blurry and lost his hearing prior to passing out.  Pt was out for appx 5 minutes and does not remember much after passing out, but was not post-ictal per ems.  Pt is currently A&O x4 and in NAD.

## 2022-02-27 NOTE — ED Notes (Signed)
Patient transported back from CT 

## 2022-02-27 NOTE — ED Notes (Signed)
Pt given ginger ale at this time.  

## 2022-02-27 NOTE — ED Provider Notes (Signed)
[facility] Provider Note    Event Date/Time   First MD Initiated Contact with Patient 02/27/22 2131     (approximate)   History   Loss of Consciousness and Fall   HPI  Timothy Fields is a 32 y.o. male who presents after an episode of loss of consciousness.  Patient tells me that he ate a large meal and then him and his girlfriend walked into [employer].  He then had sharp pain in the right lower abdomen and then he was walking to the bathroom because he felt like he needed to go his vision went dark and he was not able to hear.  He also felt some pain in the chest as well as some palpitations.  Felt lightheaded.  His girlfriend notes that he then fell and as he was falling down hit his head and teeth on a ledge.  He lost consciousness and had shaking episode that she describes as flopping like a fish.  He thinks it lasted for about 50 seconds.  He then was going in and out of consciousness had another episode where he was shaking but less pronounced.   Patient denies any family history of sudden cardiac death or heart problems in young people.  No history of seizures.  He uses marijuana recreationally but denies other drug or alcohol use.  Denies any active abdominal pain or chest pain currently.  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 --      Excl. in [place]? --     Most recent vital signs: Vitals:    02/27/22 2107 02/27/22 2221  BP: 94/63   Pulse: 96 88  Resp: 19 16  Temp: 98.1 F (36.7 C)   SpO2: 96% 97%     General: Awake, no distress.  CV:  Good peripheral perfusion.  Resp:  Normal effort.  Lungs are clear Abd:  No distention.  Abdomen is soft and nontender Neuro:             Awake, Alert, Oriented x 3  Other:  There is some erythema and abrasion of the gingiva above the left lateral incisor, no dental fracture or subluxation No trismus or facial tenderness Aox3, nml speech  PERRL, EOMI, face symmetric, nml tongue movement  5/5 strength in the BL upper and lower extremities  Sensation grossly intact in the BL upper and lower extremities  Finger-nose-finger intact BL   ED Results / Procedures / Treatments  Labs (all labs ordered are listed, but only abnormal results are displayed) Labs Reviewed  COMPREHENSIVE METABOLIC PANEL - Abnormal; Notable for  the following components:      Result Value   Glucose, Bld 148 (*)    Total Bilirubin 1.7 (*)    All other components within normal limits  CBC WITH DIFFERENTIAL/PLATELET  TROPONIN I (HIGH SENSITIVITY)     EKG  EKG interpretation performed by myself: NSR, nml axis, incomplete RBBB, no acute ischemic changes    RADIOLOGY I reviewed and interpreted the CT scan of the brain which does not show any acute intracranial process    PROCEDURES:  Critical Care performed: No  Procedures  The patient is on the cardiac monitor to evaluate for evidence of arrhythmia and/or significant heart rate changes.   MEDICATIONS ORDERED IN ED: Medications - No data to display   IMPRESSION / MDM / [place] / ED COURSE  I reviewed the triage vital signs and the nursing notes.                              Patient's presentation is most consistent with acute complicated illness / injury requiring diagnostic workup.  Differential diagnosis includes, but is not limited to, seizure, vasovagal syncope, cardiogenic  syncope, orthostatic syncope  Patient is a 32 year old male presents after episode of loss of consciousness.  This occurred while he was walking to the store and he had prodrome of sudden onset abdominal pain followed by lightheadedness difficulty seeing difficulty hearing.  He was then witnessed to lost consciousness and did hit his head mouth while going down.  He is girlfriend says he had shaking of bilateral upper and lower extremities for about 50 seconds.  He did not urinate on himself.  Apparently there was a period where he was coming in and out of consciousness and then he came to and seemed little bit confused.  Patient is alert and oriented now.  Only remembers the prodrome to the event.  He denies any abdominal pain or chest pain currently.  Denies dyspnea.  Denies history of similar.  No drug use other than marijuana.  He has no family history of sudden cardiac death in young people.  Has history of chronic headaches but nothing new no other acute neurologic symptoms.  Vital signs reassuring.  On exam he looks well.  Does have a abrasion on the gingiva but there is no dental fracture or subluxation no concern for mandibular fracture facial fracture.  CT head and C-spine was ordered from triage is are negative for acute injury.  Cardiopulmonary exam is reassuring no murmurs.  EKG does not suggest any significant interval abnormality or other cardiogenic cause of syncope such as Brugada syndrome WPW prolonged QT.  DC patient is on several psychiatric medications that could cause syncope but again there is no concerning features on his EKG so feel this is less likely.  Seizure for syncope.  Given prodrome I suspect this is likely syncopal episode although the shaking activity described by girlfriend does seem to be a little bit more than what it be expected just for seizure alone.  However he has no history of seizures so given this would have been a first-time seizure would not start any antibiotics  anyway.  CBC BMP and troponin were obtained these are all reassuring.  Patient tolerating p.o. feeling improved able to ambulate without feeling symptomatic.  Discussed following up with his primary doctor.  Discussed that if he were to have another episode like this with concern for seizure activity [place] need to see  neurologist but my overall suspicion is that this was vasovagal syncope brought on by the abdominal pain that is now passed.      FINAL CLINICAL IMPRESSION(S) / ED DIAGNOSES   Final diagnoses:  Loss of consciousness ([place])  Seizure-like activity ([place])     Rx / DC Orders   ED Discharge Orders     None        Note:  This document was prepared using Dragon voice recognition software and [place] include unintentional dictation errors.   Rada Hay, MD 02/27/22 2225

## 2022-09-27 ENCOUNTER — Ambulatory Visit: Payer: Medicaid Other

## 2023-04-29 ENCOUNTER — Telehealth: Payer: Self-pay | Admitting: Family Medicine

## 2023-04-29 NOTE — Telephone Encounter (Signed)
 Pt came in to get Meds was last seen in 2020 is having a out break talked with Lorene Dy stated was your call to either give meds or make an appt

## 2023-05-01 ENCOUNTER — Ambulatory Visit: Admitting: Family Medicine

## 2023-05-01 DIAGNOSIS — Z113 Encounter for screening for infections with a predominantly sexual mode of transmission: Secondary | ICD-10-CM

## 2023-05-01 DIAGNOSIS — B009 Herpesviral infection, unspecified: Secondary | ICD-10-CM

## 2023-05-01 LAB — HM HIV SCREENING LAB: HM HIV Screening: NEGATIVE

## 2023-05-01 MED ORDER — VALACYCLOVIR HCL 500 MG PO TABS: 500.0000 mg | ORAL_TABLET | Freq: Every day | ORAL | 12 refills | Status: AC

## 2023-05-01 NOTE — Progress Notes (Signed)
 [facility] Department STI clinic [address] [place] [address] Main phone: [phone]  STI screening visit  Subjective:  Timothy Fields is a 33 y.o. male being seen today for an STI screening visit. The patient reports they do not have symptoms.    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 Desires blood work today   STI screening history: Last HIV test per patient/review of record was No results found for: "HMHIVSCREEN"  Lab Results  Component Value Date   HIV NON REACTIVE 02/17/2016    Last HEPC test per patient/review of record was No results found for: "HMHEPCSCREEN" No components found for: "HEPC"   Last HEPB test per patient/review of record was No components found for: "HMHEPBSCREEN"   Fertility: Does the patient or their partner desires a pregnancy in the next year? No  Screening for MPX risk: Does the patient have an unexplained rash? No Is the patient MSM? No Does the patient endorse multiple sex partners or anonymous sex partners? No Did the patient have close or sexual contact with a person diagnosed with MPX? No Has the patient traveled outside the [place] where MPX is endemic? No Is there a high clinical suspicion for MPX-- evidenced by one of the following No  -Unlikely to be chickenpox  -Lymphadenopathy  -Rash that present in same phase of evolution on any given body part   See flowsheet for further details and programmatic requirements.   Immunization History  Administered Date(s) Administered   Hepatitis A 05/15/2006   Hepatitis B 1992/03/17, 10/17/1992, 01/29/1995   Influenza, Seasonal, Injecte, Preservative Fre  10/19/2010   MMR 01/29/1995, 05/15/2006   Meningococcal Conjugate 05/15/2006   Tdap 05/15/2006     The following portions of the patient's history were reviewed and updated as appropriate: allergies, current medications, past medical history, past social history, past surgical history and problem list.  Objective:  There were no vitals filed for this visit.  Physical Exam Vitals and nursing note reviewed.  Constitutional:      Appearance: Normal appearance.  HENT:     Head: Normocephalic and atraumatic.     Mouth/Throat:     Mouth: Mucous membranes are moist.     Pharynx: No oropharyngeal exudate or posterior oropharyngeal erythema.  Eyes:     General:        Right eye: No discharge.        Left eye: No discharge.     Conjunctiva/sclera:     Right eye: Right conjunctiva is not injected. No exudate.    Left eye: Left conjunctiva is not injected. No exudate. Pulmonary:     Effort: Pulmonary effort is normal.  Abdominal:     General: Abdomen is flat.     Palpations: Abdomen is soft. There is no hepatomegaly or mass.     Tenderness: There is no abdominal tenderness. There is no rebound.  Genitourinary:    Comments: Declined genital exam- asymptomatic Lymphadenopathy:     Cervical: No cervical adenopathy.     Upper Body:     Right upper body: No supraclavicular or axillary adenopathy.     Left upper body: No supraclavicular or axillary adenopathy.  Skin:    General: Skin is warm and dry.  Neurological:     Mental Status: He is alert and oriented to person, place, and time.     Assessment and Plan:  Timothy Fields is a 33 y.o. male presenting to the [facility] Department for STI screening  1. Screening for venereal disease (Primary) -given housing resources today  - HIV [facility] - Syphilis Serology, [facility]   2. HSV-2 infection  - valACYclovir (VALTREX) 500 MG tablet; Take 1 tablet (500 mg total) by mouth daily.  Dispense: 30 tablet; Refill:  12    Patient does not have STI symptoms Patient accepted screenings including  urine GC/Chlamydia, and blood work for HIV/Syphilis. Patient meets criteria for HepB screening? No. Ordered? not applicable Patient meets criteria for HepC screening? No. Ordered? not applicable Recommended condom use with all sex Discussed importance of condom use for STI prevention  Treat positive test results per standing order. Discussed time line for [facility] results and that patient will be called with positive results and encouraged patient to call if he had not heard in 2 weeks Recommended repeat testing in 3 months with positive results. Recommended returning for continued or worsening symptoms.   Return if symptoms worsen or fail to improve, for STI screening.  No future appointments.  Lenice Llamas, [place]
